# Patient Record
Sex: Male | Born: 1970 | Race: White | Hispanic: No | Marital: Married | Smoking: Former smoker
Health system: Southern US, Community
[De-identification: ages and names within clinical notes are randomized; demographics above are authoritative.]

## PROBLEM LIST (undated history)

## (undated) DIAGNOSIS — B009 Herpesviral infection, unspecified: Secondary | ICD-10-CM

## (undated) DIAGNOSIS — G4730 Sleep apnea, unspecified: Secondary | ICD-10-CM

## (undated) DIAGNOSIS — W3400XA Accidental discharge from unspecified firearms or gun, initial encounter: Secondary | ICD-10-CM

## (undated) DIAGNOSIS — R51 Headache: Secondary | ICD-10-CM

## (undated) DIAGNOSIS — S81839A Puncture wound without foreign body, unspecified lower leg, initial encounter: Secondary | ICD-10-CM

## (undated) DIAGNOSIS — F419 Anxiety disorder, unspecified: Secondary | ICD-10-CM

## (undated) DIAGNOSIS — E785 Hyperlipidemia, unspecified: Secondary | ICD-10-CM

## (undated) DIAGNOSIS — R739 Hyperglycemia, unspecified: Secondary | ICD-10-CM

## (undated) HISTORY — PX: VASECTOMY: SHX75

## (undated) HISTORY — PX: DENTAL TRAUMA REPAIR (TOOTH REIMPLANTATION): SHX1450

## (undated) HISTORY — DX: Hyperglycemia, unspecified: R73.9

## (undated) HISTORY — DX: Herpesviral infection, unspecified: B00.9

## (undated) HISTORY — DX: Headache: R51

## (undated) HISTORY — PX: WISDOM TOOTH EXTRACTION: SHX21

## (undated) HISTORY — DX: Hyperlipidemia, unspecified: E78.5

---

## 1999-05-07 DIAGNOSIS — S81839A Puncture wound without foreign body, unspecified lower leg, initial encounter: Secondary | ICD-10-CM

## 1999-05-07 HISTORY — DX: Puncture wound without foreign body, unspecified lower leg, initial encounter: S81.839A

## 2003-05-12 ENCOUNTER — Encounter: Admission: RE | Admit: 2003-05-12 | Discharge: 2003-05-12 | Payer: Self-pay | Admitting: Internal Medicine

## 2003-06-18 ENCOUNTER — Inpatient Hospital Stay (HOSPITAL_COMMUNITY): Admission: EM | Admit: 2003-06-18 | Discharge: 2003-06-20 | Payer: Self-pay | Admitting: *Deleted

## 2004-04-02 ENCOUNTER — Ambulatory Visit: Payer: Self-pay | Admitting: Internal Medicine

## 2004-05-06 DIAGNOSIS — B009 Herpesviral infection, unspecified: Secondary | ICD-10-CM

## 2004-05-06 HISTORY — DX: Herpesviral infection, unspecified: B00.9

## 2004-12-17 ENCOUNTER — Ambulatory Visit: Payer: Self-pay | Admitting: Family Medicine

## 2005-10-31 ENCOUNTER — Ambulatory Visit: Payer: Self-pay | Admitting: Internal Medicine

## 2005-11-01 ENCOUNTER — Ambulatory Visit: Payer: Self-pay | Admitting: Internal Medicine

## 2006-07-01 ENCOUNTER — Ambulatory Visit: Payer: Self-pay | Admitting: Internal Medicine

## 2006-10-01 ENCOUNTER — Ambulatory Visit: Payer: Self-pay | Admitting: Internal Medicine

## 2007-05-11 ENCOUNTER — Telehealth: Payer: Self-pay | Admitting: Internal Medicine

## 2007-05-26 ENCOUNTER — Ambulatory Visit: Payer: Self-pay | Admitting: Internal Medicine

## 2007-05-29 ENCOUNTER — Telehealth: Payer: Self-pay | Admitting: Internal Medicine

## 2008-01-05 ENCOUNTER — Emergency Department (HOSPITAL_BASED_OUTPATIENT_CLINIC_OR_DEPARTMENT_OTHER): Admission: EM | Admit: 2008-01-05 | Discharge: 2008-01-05 | Payer: Self-pay | Admitting: Emergency Medicine

## 2008-05-02 ENCOUNTER — Telehealth (INDEPENDENT_AMBULATORY_CARE_PROVIDER_SITE_OTHER): Payer: Self-pay | Admitting: *Deleted

## 2008-06-17 ENCOUNTER — Ambulatory Visit: Payer: Self-pay | Admitting: Internal Medicine

## 2008-06-27 LAB — CONVERTED CEMR LAB
ALT: 27 U/L (ref 0–53)
AST: 21 U/L (ref 0–37)
Albumin: 4.1 g/dL (ref 3.5–5.2)
Alkaline Phosphatase: 52 U/L (ref 39–117)
BUN: 9 mg/dL (ref 6–23)
Basophils Absolute: 0 K/uL (ref 0.0–0.1)
Basophils Relative: 0.1 % (ref 0.0–3.0)
Bilirubin, Direct: 0.1 mg/dL (ref 0.0–0.3)
CO2: 31 meq/L (ref 19–32)
Calcium: 9.3 mg/dL (ref 8.4–10.5)
Chloride: 104 meq/L (ref 96–112)
Cholesterol: 244 mg/dL (ref 0–200)
Creatinine, Ser: 1 mg/dL (ref 0.4–1.5)
Direct LDL: 175.4 mg/dL
Eosinophils Absolute: 0.3 K/uL (ref 0.0–0.7)
Eosinophils Relative: 4.4 % (ref 0.0–5.0)
GFR calc Af Amer: 107 mL/min
GFR calc non Af Amer: 88 mL/min
Glucose, Bld: 107 mg/dL — ABNORMAL HIGH (ref 70–99)
HCT: 46.8 % (ref 39.0–52.0)
HDL: 27.5 mg/dL — ABNORMAL LOW (ref >39.0)
Hemoglobin: 16.4 g/dL (ref 13.0–17.0)
Lymphocytes Relative: 35.2 % (ref 12.0–46.0)
MCHC: 35 g/dL (ref 30.0–36.0)
MCV: 85 fL (ref 78.0–100.0)
Monocytes Absolute: 0.6 K/uL (ref 0.1–1.0)
Monocytes Relative: 8.6 % (ref 3.0–12.0)
Neutro Abs: 3.3 K/uL (ref 1.4–7.7)
Neutrophils Relative %: 51.7 % (ref 43.0–77.0)
PSA: 0.37 ng/mL (ref 0.10–4.00)
Platelets: 296 K/uL (ref 150–400)
Potassium: 4 meq/L (ref 3.5–5.1)
RBC: 5.5 M/uL (ref 4.22–5.81)
RDW: 12.6 % (ref 11.5–14.6)
Sodium: 140 meq/L (ref 135–145)
TSH: 0.72 u[IU]/mL (ref 0.35–5.50)
Total Bilirubin: 0.7 mg/dL (ref 0.3–1.2)
Total CHOL/HDL Ratio: 8.9
Total Protein: 6.7 g/dL (ref 6.0–8.3)
Triglycerides: 174 mg/dL — ABNORMAL HIGH (ref 0–149)
VLDL: 35 mg/dL (ref 0–40)
WBC: 6.5 10*3/microliter (ref 4.5–10.5)

## 2008-06-28 ENCOUNTER — Encounter (INDEPENDENT_AMBULATORY_CARE_PROVIDER_SITE_OTHER): Payer: Self-pay | Admitting: *Deleted

## 2008-07-01 ENCOUNTER — Ambulatory Visit: Payer: Self-pay | Admitting: Internal Medicine

## 2008-07-01 DIAGNOSIS — R7303 Prediabetes: Secondary | ICD-10-CM | POA: Insufficient documentation

## 2008-07-01 DIAGNOSIS — E785 Hyperlipidemia, unspecified: Secondary | ICD-10-CM | POA: Insufficient documentation

## 2008-07-01 DIAGNOSIS — T148XXA Other injury of unspecified body region, initial encounter: Secondary | ICD-10-CM | POA: Insufficient documentation

## 2009-01-23 ENCOUNTER — Telehealth (INDEPENDENT_AMBULATORY_CARE_PROVIDER_SITE_OTHER): Payer: Self-pay | Admitting: *Deleted

## 2009-06-05 ENCOUNTER — Encounter (INDEPENDENT_AMBULATORY_CARE_PROVIDER_SITE_OTHER): Payer: Self-pay | Admitting: *Deleted

## 2009-06-06 ENCOUNTER — Telehealth (INDEPENDENT_AMBULATORY_CARE_PROVIDER_SITE_OTHER): Payer: Self-pay | Admitting: *Deleted

## 2009-06-23 ENCOUNTER — Ambulatory Visit: Payer: Self-pay | Admitting: Internal Medicine

## 2009-06-23 LAB — CONVERTED CEMR LAB
Bilirubin Urine: NEGATIVE
Blood in Urine, dipstick: NEGATIVE
Glucose, Urine, Semiquant: NEGATIVE
Ketones, urine, test strip: NEGATIVE
Nitrite: NEGATIVE
Protein, U semiquant: NEGATIVE
Specific Gravity, Urine: 1.01
Urobilinogen, UA: 0.2
WBC Urine, dipstick: NEGATIVE
pH: 5

## 2009-06-26 ENCOUNTER — Ambulatory Visit: Payer: Self-pay | Admitting: Internal Medicine

## 2009-06-26 DIAGNOSIS — J45909 Unspecified asthma, uncomplicated: Secondary | ICD-10-CM | POA: Insufficient documentation

## 2009-06-26 LAB — CONVERTED CEMR LAB
ALT: 32 units/L (ref 0–53)
AST: 25 units/L (ref 0–37)
Albumin: 4.1 g/dL (ref 3.5–5.2)
Alkaline Phosphatase: 48 units/L (ref 39–117)
BUN: 10 mg/dL (ref 6–23)
Basophils Absolute: 0 10*3/uL (ref 0.0–0.1)
Basophils Relative: 0.1 % (ref 0.0–3.0)
Bilirubin, Direct: 0.1 mg/dL (ref 0.0–0.3)
CO2: 30 meq/L (ref 19–32)
Calcium: 9.3 mg/dL (ref 8.4–10.5)
Chloride: 105 meq/L (ref 96–112)
Cholesterol: 233 mg/dL — ABNORMAL HIGH (ref 0–200)
Creatinine, Ser: 1.2 mg/dL (ref 0.4–1.5)
Direct LDL: 158.9 mg/dL
Eosinophils Absolute: 0.4 10*3/uL (ref 0.0–0.7)
Eosinophils Relative: 6.2 % — ABNORMAL HIGH (ref 0.0–5.0)
GFR calc non Af Amer: 70.92 mL/min (ref >60)
Glucose, Bld: 90 mg/dL (ref 70–99)
HCT: 47.3 % (ref 39.0–52.0)
HDL: 34.7 mg/dL — ABNORMAL LOW (ref >39.00)
Hemoglobin: 15.4 g/dL (ref 13.0–17.0)
Lymphocytes Relative: 42.5 % (ref 12.0–46.0)
Lymphs Abs: 2.5 10*3/uL (ref 0.7–4.0)
MCHC: 32.6 g/dL (ref 30.0–36.0)
MCV: 89.1 fL (ref 78.0–100.0)
Monocytes Absolute: 0.6 10*3/uL (ref 0.1–1.0)
Monocytes Relative: 9.6 % (ref 3.0–12.0)
Neutro Abs: 2.5 10*3/uL (ref 1.4–7.7)
Neutrophils Relative %: 41.6 % — ABNORMAL LOW (ref 43.0–77.0)
PSA: 0.29 ng/mL (ref 0.10–4.00)
Platelets: 291 10*3/uL (ref 150.0–400.0)
Potassium: 4.6 meq/L (ref 3.5–5.1)
RBC: 5.3 M/uL (ref 4.22–5.81)
RDW: 12.4 % (ref 11.5–14.6)
Sodium: 140 meq/L (ref 135–145)
TSH: 0.79 microintl units/mL (ref 0.35–5.50)
Total Bilirubin: 0.5 mg/dL (ref 0.3–1.2)
Total CHOL/HDL Ratio: 7
Total Protein: 7 g/dL (ref 6.0–8.3)
Triglycerides: 232 mg/dL — ABNORMAL HIGH (ref 0.0–149.0)
VLDL: 46.4 mg/dL — ABNORMAL HIGH (ref 0.0–40.0)
WBC: 6 10*3/uL (ref 4.5–10.5)

## 2010-06-05 NOTE — Assessment & Plan Note (Signed)
  please see chart entry

## 2010-06-05 NOTE — Progress Notes (Signed)
Summary: Gilbert Perkins-inhaler prescription  Phone Note Call from Patient Call back at Home Phone [phone]   Summary of Call: pt is calling for new prescript for an inhale to be call in to [facility] [address] in [place] Initial call taken by: Freddy Jaksch,  May 11, 2007 2:20 PM  Follow-up for Phone Call        Patient was given a sample of an inhaler at his last office visit (09/2006), doesn't remember the name. Patient was wondering if he could have a prescription, patient aware that I will discuss with Dr.Jessalyn Hinojosa and contact him back. Reviewed paper chart and EMR(forwarded to Dr.) Follow-up by: [name],  May 12, 2007 11:57 AM  Additional Follow-up for Phone Call Additional follow up Details #1::        albuterol HFA  MDI #1, RX 2. 1-2 puffs q 4-6 hrs prn Additional Follow-up by: [name] MD,  May 12, 2007 1:27 PM    Additional Follow-up for Phone Call Additional follow up Details #2::    rx sent to pharmacy pt informed...................................................................Marland KitchenKandice Hams  May 12, 2007 4:51 PM  Follow-up by: Kandice Hams,  May 12, 2007 4:51 PM  New/Updated Medications: PROVENTIL HFA 108 (90 BASE) MCG/ACT  AERS (ALBUTEROL SULFATE) 1-2 puffs every 4-6 hours as needed   Prescriptions: PROVENTIL HFA 108 (90 BASE) MCG/ACT  AERS (ALBUTEROL SULFATE) 1-2 puffs every 4-6 hours as needed  #1 x 2   Entered by:   Kandice Hams   Authorized by:   [name] MD   Signed by:   Kandice Hams on 05/12/2007   Method used:   Electronically sent to ...       [facility] # [phone]*       [address] [place], [place]  [address]       Ph: [phone]       Fax: [phone]   RxID:   [id number]

## 2010-06-05 NOTE — Progress Notes (Signed)
  Phone Note Call from Patient   Caller: Patient Call For: hopp Reason for Call: Refill Medication Summary of Call: Pt states he has called 2x and has not heard back about refill on xanax--- I did not have access to computer when he called.  I told  pt that we did not call in xanax after hours and I would inform hopp of request. Initial call taken by: Loreen Freud DO,  May 02, 2008 11:14 PM      Prescriptions: ALPRAZOLAM 0.5 MG  TABS (ALPRAZOLAM) take 1/2-1 tablet as needed  #30 x 0   Entered and Authorized by:   [name] MD   Signed by:   Kandice Hams on 05/03/2008   Method used:   Printed then faxed to ...       [facility] [address] # [phone]* (retail)       [address] [place], [place]  [address]       Ph: [phone]       Fax: [phone]   RxID:   [id number]   Appended Document:  pt informed rx faxed to [facility]
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
## 2010-06-05 NOTE — Progress Notes (Signed)
Summary: Medication Concerns  Phone Note Call from Patient Call back at Home Phone [phone]   Caller: Patient Summary of Call: Message left on VM: Patient would like to know if Alprazolam was filled. Patient schedule appointment for labs and CPX.   **Place orders for labs** Initial call taken by: [name],  June 06, 2009 10:50 AM  Follow-up for Phone Call        Left message with pharmacy if they did not receive fax yesterday for Alprazolam to fill at this time. Follow-up by: [name],  June 06, 2009 10:56 AM
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
## 2010-06-05 NOTE — Letter (Signed)
Summary: Results Follow up Letter  [facility] [place]  [address] [place], [place] [address]   Phone: [phone]  Fax: [phone]    06/28/2008 MRN: [id number]  [facility] [address] [place], [place]  [address]  Dear Gilbert Perkins,  The following are the results of your recent test(s):  Test         Result    Pap Smear:        Normal _____  Not Normal _____ Comments: ______________________________________________________ Cholesterol: LDL(Bad cholesterol):         Your goal is less than:         HDL (Good cholesterol):       Your goal is more than: Comments:  ______________________________________________________ Mammogram:        Normal _____  Not Normal _____ Comments:  ___________________________________________________________________ Hemoccult:        Normal _____  Not normal _______ Comments:    _____________________________________________________________________ Other Tests: PLEASE SEE ATTACHED LABS DONE ON 06/17/2008    We routinely do not discuss normal results over the telephone.  If you desire a copy of the results, or you have any questions about this information we can discuss them at your next office visit.   Sincerely,

## 2010-06-05 NOTE — Progress Notes (Signed)
Summary: PT IS STILL HAVING PAIN   Phone Note Call from Patient Call back at Home Phone [phone]   Caller: Patient Reason for Call: Acute Illness, Talk to Nurse Summary of Call: DR Lexie Koehl// PT WANTS TO LET YOU KNOW THAT THE SWELLING HAS GONE DOWN BUT HE IS STILL IN PAIN. PT WANTS TO KNOW IF THERE IS ANYTHING HE COULD DO FOR THE PAIN. Initial call taken by: Gilbert Perkins,  May 29, 2007 1:30 PM  Follow-up for Phone Call        PATIENT STILL WITH PAIN IN GROIN AREA, PATIENT SAID PAIN IS ABOUT THE SAME AS IT WAS WHEN HE WAS HERE. PATIENT WANTS TO KNOW WHAT CAN BE DONE ABOUT THE PAIN? Follow-up by: [name],  May 29, 2007 2:07 PM  Additional Follow-up for Phone Call Additional follow up Details #1::        D/C Ultram ; Tylenol # 3 1-2 q 4-6 hrs as needed #30  Additional Follow-up by: [name] MD,  May 29, 2007 2:30 PM    Additional Follow-up for Phone Call Additional follow up Details #2::    SPOKE WITH PATIENT HE SAID TYLENOL#3 REALLY UPSETS HIS STOMACH ANYTHING ELSE WILL BE FINE TO CALL IN TO [facility] [address] [id number]. WILL FORWARD TO DR.[name] FOR DIFFFERENT RX THEN WILL PHONE IN. ..................................................................Marland KitchenChrae Malloy  May 29, 2007 2:38 PM   Additional Follow-up for Phone Call Additional follow up Details #3:: Details for Additional Follow-up Action Taken: Darvocet N 100  1-2 q 4-6 hrs as needed #30 Additional Follow-up by: [name] MD,  May 29, 2007 5:24 PM       Appended Document: PT IS STILL HAVING PAIN  called patient and let him know I called darvocet n-100 to the pharmacist.  .....................................................................kathy bixby, cma

## 2010-06-05 NOTE — Letter (Signed)
Summary: Primary Care Appointment Letter  Gilbert Perkins at [place]  [address] [place], [place] [address]   Phone: [phone]  Fax: [phone]    06/05/2009 MRN: [id number]  [name] [address] [place], [place]  [address]  Dear Gilbert Perkins,   Your Primary Care Physician Gilbert Melnick MD has indicated that:    ___X____it is time to schedule an appointment (Please call to schedule a Physical"30 min appointment" )Come fasting so that any necessary labs can be drawn.  An appointment is necessary to continue refilling medications    _______you missed your appointment on______ and need to call and          reschedule.    _______you need to have lab work done.    _______you need to schedule an appointment discuss lab or test results.    _______you need to call to reschedule your appointment that is                       scheduled on _________.     Please call our office as soon as possible. Our phone number is [phone]. Please press option 1. Our office is open 8a-12noon and 1p-5p, Monday through Friday.     Thank you,    Gilbert Perkins Primary Care Scheduler

## 2010-06-05 NOTE — Progress Notes (Signed)
Summary: refill   Phone Note Refill Request   Refills Requested: Medication #1:  GABAPENTIN 100 MG CAPS 1-2 every 8 hrs as needed. last filled 12-27-08 #60, last OV 07-01-08,is this med to be added to pt list on routine basis to be filled.Marland KitchenMarland KitchenFelecia Deloach CMA  January 23, 2009 3:42 PM    Follow-up for Phone Call        OK X1 Follow-up by: [name] MD,  January 23, 2009 3:52 PM  Additional Follow-up for Phone Call Additional follow up Details #1::        per dr hopper med ok to be filled x1year.Felecia Deloach CMA  January 24, 2009 10:17 AM     Prescriptions: GABAPENTIN 100 MG CAPS (GABAPENTIN) 1-2 every 8 hrs as needed  #60.0 Each x 0   Entered by:   Jeremy Johann CMA   Authorized by:   [name] MD   Signed by:   Jeremy Johann CMA on 01/23/2009   Method used:   Faxed to ...       [facility] [address] # [phone]* (retail)       [address] [place], [place]  [address]       Ph: [phone]       Fax: [phone]   RxID:   [id number]

## 2010-07-31 ENCOUNTER — Other Ambulatory Visit: Payer: Self-pay | Admitting: Internal Medicine

## 2010-08-05 ENCOUNTER — Other Ambulatory Visit: Payer: Self-pay | Admitting: Internal Medicine

## 2010-09-16 ENCOUNTER — Other Ambulatory Visit: Payer: Self-pay | Admitting: Internal Medicine

## 2010-09-17 NOTE — Telephone Encounter (Signed)
Please contact patient and schedule a CPX (30 min appointment), encounter to be signed once appointment scheduled

## 2010-09-19 NOTE — Telephone Encounter (Signed)
Spoke to pt on 5/15 to schedule CPX---he will call back when he has his calendar available///sph
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
## 2010-09-21 NOTE — Discharge Summary (Signed)
NAME:  Gilbert Perkins, Gilbert Perkins                          ACCOUNT NO.:  [id number]   MEDICAL RECORD NO.:  [id number]                   PATIENT TYPE:  INP   LOCATION:  2035                                 FACILITY:  [facility]   PHYSICIAN:  Rene Paci, M.D. [facility]          DATE OF BIRTH:  03-04-1971   DATE OF ADMISSION:  06/18/2003  DATE OF DISCHARGE:  06/20/2003                                 DISCHARGE SUMMARY   DISCHARGE DIAGNOSIS:  Atypical chest pain.   BRIEF ADMISSION HISTORY:  Mr. Corbit is a 40 year old white male who  presented with chest pain around 3:30 on the day of admission.  He describes  the pain as pressure and tightness.  Duration was 30 to 45 minutes.  It was  associated with radiation to the left arm, nausea and diaphoresis.  He did  have relief with sublingual nitroglycerin.  The patient was admitted for  further cardiac evaluation.  Cardiac risk factors were positive for tobacco  use and remote coronary disease in his family.  Negative for hypertension,  diabetes or hyperlipidemia.   HOSPITAL COURSE:  Chest pain - The patient was admitted to rule out  myocardial infarction.  Serial cardiac enzymes were negative.  The patient  was seen in consultation by cardiology.  They did not feel that this was a  cardiac chest pain.  They recommended discontinuing his heparin and  nitroglycerin drip.  They felt that he was stable for discharge home with an  outpatient Cardiolite.  Cardiolite will be arranged.   LABORATORIES AT DISCHARGE:  A fasting lipid profile is pending.   MEDICATIONS AT DISCHARGE:  He is instructed to resume his Wellbutrin and  Klonopin at home.   FOLLOW UP:  Followup with Dr. Alwyn Ren in two to three weeks.  We will also  schedule an outpatient Cardiolite.      Cornell Barman, P.A. [facility]                  Rene Paci, M.D. [facility]    LC/MEDQ  D:  06/20/2003  T:  06/20/2003  Job:  [id number]   cc:   Titus Dubin. Alwyn Ren, M.D. [facility]   Doylene Canning. Ladona Ridgel, M.D.
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
## 2010-09-28 ENCOUNTER — Other Ambulatory Visit: Payer: Self-pay | Admitting: Internal Medicine

## 2010-10-11 NOTE — Telephone Encounter (Signed)
Patient has CPX appt for 01/14/11 at 2:00

## 2010-11-04 ENCOUNTER — Other Ambulatory Visit: Payer: Self-pay | Admitting: Internal Medicine

## 2010-11-15 ENCOUNTER — Emergency Department (HOSPITAL_BASED_OUTPATIENT_CLINIC_OR_DEPARTMENT_OTHER)
Admission: EM | Admit: 2010-11-15 | Discharge: 2010-11-15 | Disposition: A | Discharge status: Home / Self Care | Payer: Worker's Compensation | Attending: Emergency Medicine | Admitting: Emergency Medicine

## 2010-11-15 ENCOUNTER — Encounter: Payer: Self-pay | Admitting: *Deleted

## 2010-11-15 DIAGNOSIS — S8000XA Contusion of unspecified knee, initial encounter: Secondary | ICD-10-CM | POA: Insufficient documentation

## 2010-11-15 DIAGNOSIS — Y92410 Unspecified street and highway as the place of occurrence of the external cause: Secondary | ICD-10-CM | POA: Insufficient documentation

## 2010-11-15 MED ORDER — IBUPROFEN 800 MG PO TABS
800.0000 mg | ORAL_TABLET | Freq: Once | ORAL | Status: AC
  Administered 2010-11-15: 800 mg via ORAL
  Filled 2010-11-15: qty 1

## 2010-11-15 MED ORDER — IBUPROFEN 800 MG PO TABS: 800.0000 mg | ORAL_TABLET | Freq: Three times a day (TID) | ORAL | Status: DC | PRN

## 2010-11-15 MED ORDER — IBUPROFEN 800 MG PO TABS: 800.0000 mg | ORAL_TABLET | Freq: Three times a day (TID) | ORAL | Status: AC | PRN
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
## 2010-11-15 NOTE — ED Notes (Signed)
Seargeant Gilbert Perkins informed this RN that no UDS or BAT is required.
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
## 2010-12-25 ENCOUNTER — Other Ambulatory Visit: Payer: Self-pay | Admitting: Internal Medicine

## 2011-01-14 ENCOUNTER — Encounter: Payer: Self-pay | Admitting: Internal Medicine

## 2011-02-17 ENCOUNTER — Other Ambulatory Visit: Payer: Self-pay | Admitting: Internal Medicine

## 2011-03-26 ENCOUNTER — Encounter: Payer: Self-pay | Admitting: Internal Medicine

## 2011-04-01 ENCOUNTER — Encounter: Payer: Self-pay | Admitting: Internal Medicine

## 2011-04-01 ENCOUNTER — Ambulatory Visit (INDEPENDENT_AMBULATORY_CARE_PROVIDER_SITE_OTHER): Payer: 59 | Admitting: Internal Medicine

## 2011-04-01 VITALS — BP 122/86 | HR 96 | Temp 98.6°F | Resp 12 | Ht 71.0 in | Wt 232.0 lb

## 2011-04-01 DIAGNOSIS — Z0000 Encounter for general adult medical examination without abnormal findings: Secondary | ICD-10-CM

## 2011-04-01 DIAGNOSIS — R0989 Other specified symptoms and signs involving the circulatory and respiratory systems: Secondary | ICD-10-CM

## 2011-04-01 DIAGNOSIS — E785 Hyperlipidemia, unspecified: Secondary | ICD-10-CM

## 2011-04-01 DIAGNOSIS — R0609 Other forms of dyspnea: Secondary | ICD-10-CM

## 2011-04-01 DIAGNOSIS — T148XXA Other injury of unspecified body region, initial encounter: Secondary | ICD-10-CM

## 2011-04-01 MED ORDER — VALACYCLOVIR HCL 1 G PO TABS: 1000.0000 mg | ORAL_TABLET | Freq: Every day | ORAL | Status: DC

## 2011-04-01 MED ORDER — ALBUTEROL SULFATE HFA 108 (90 BASE) MCG/ACT IN AERS: 1.0000 | INHALATION_SPRAY | Freq: Four times a day (QID) | RESPIRATORY_TRACT | Status: DC | PRN

## 2011-04-01 MED ORDER — ALPRAZOLAM 0.5 MG PO TABS: 0.5000 mg | ORAL_TABLET | Freq: Three times a day (TID) | ORAL | Status: DC | PRN

## 2011-04-01 NOTE — Patient Instructions (Signed)
Preventive Health Care: Exercise at least 30-45 minutes a day,  3-4 days a week.  Eat a low-fat diet with lots of fruits and vegetables, up to 7-9 servings per day.Consume less than 40 grams of sugar per day from foods & drinks with High Fructose Corn Sugar as # 1,2,3 or # 4 on label. Health Care Power of Attorney & Living Will. Complete if not in place ; these place you in charge of your health care decisions. To prevent sleep dysfunction follow these instructions for sleep hygiene. Do not read, watch TV, or eat in bed. Do not get into bed until you are ready to turn off the light &  to go to sleep. Do not ingest stimulants ( decongestants, diet pills, nicotine, caffeine) after the evening meal. Please  schedule fasting Labs : BMET,Lipids, hepatic panel, CBC & dif, TSH.  Please bring these instructions to that Lab appt.
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
## 2011-04-02 ENCOUNTER — Encounter: Payer: Self-pay | Admitting: Neurology

## 2011-04-02 ENCOUNTER — Encounter: Payer: Self-pay | Admitting: Internal Medicine

## 2011-04-05 ENCOUNTER — Other Ambulatory Visit: Payer: Self-pay | Admitting: Internal Medicine

## 2011-04-05 DIAGNOSIS — R0609 Other forms of dyspnea: Secondary | ICD-10-CM

## 2011-04-05 DIAGNOSIS — E785 Hyperlipidemia, unspecified: Secondary | ICD-10-CM

## 2011-04-05 DIAGNOSIS — R0989 Other specified symptoms and signs involving the circulatory and respiratory systems: Secondary | ICD-10-CM

## 2011-04-05 DIAGNOSIS — Z0000 Encounter for general adult medical examination without abnormal findings: Secondary | ICD-10-CM

## 2011-04-08 ENCOUNTER — Other Ambulatory Visit (INDEPENDENT_AMBULATORY_CARE_PROVIDER_SITE_OTHER): Payer: 59

## 2011-04-08 DIAGNOSIS — R0989 Other specified symptoms and signs involving the circulatory and respiratory systems: Secondary | ICD-10-CM

## 2011-04-08 DIAGNOSIS — E785 Hyperlipidemia, unspecified: Secondary | ICD-10-CM

## 2011-04-08 DIAGNOSIS — Z0000 Encounter for general adult medical examination without abnormal findings: Secondary | ICD-10-CM

## 2011-04-08 DIAGNOSIS — R0609 Other forms of dyspnea: Secondary | ICD-10-CM

## 2011-04-08 LAB — CBC WITH DIFFERENTIAL/PLATELET
Basophils Absolute: 0 10*3/uL (ref 0.0–0.1)
Basophils Relative: 0.7 % (ref 0.0–3.0)
Eosinophils Absolute: 0.5 10*3/uL (ref 0.0–0.7)
Eosinophils Relative: 6.1 % — ABNORMAL HIGH (ref 0.0–5.0)
HCT: 44.8 % (ref 39.0–52.0)
Hemoglobin: 15.1 g/dL (ref 13.0–17.0)
Lymphocytes Relative: 35.8 % (ref 12.0–46.0)
Lymphs Abs: 2.7 10*3/uL (ref 0.7–4.0)
MCHC: 33.8 g/dL (ref 30.0–36.0)
MCV: 86.7 fl (ref 78.0–100.0)
Monocytes Absolute: 0.6 10*3/uL (ref 0.1–1.0)
Monocytes Relative: 8.3 % (ref 3.0–12.0)
Neutro Abs: 3.7 10*3/uL (ref 1.4–7.7)
Neutrophils Relative %: 49.1 % (ref 43.0–77.0)
Platelets: 328 10*3/uL (ref 150.0–400.0)
RBC: 5.16 Mil/uL (ref 4.22–5.81)
RDW: 13.1 % (ref 11.5–14.6)
WBC: 7.5 10*3/uL (ref 4.5–10.5)

## 2011-04-08 LAB — HEPATIC FUNCTION PANEL
ALT: 32 U/L (ref 0–53)
AST: 26 U/L (ref 0–37)
Albumin: 4.1 g/dL (ref 3.5–5.2)
Alkaline Phosphatase: 51 U/L (ref 39–117)
Bilirubin, Direct: 0 mg/dL (ref 0.0–0.3)
Total Bilirubin: 0.4 mg/dL (ref 0.3–1.2)
Total Protein: 6.7 g/dL (ref 6.0–8.3)

## 2011-04-08 LAB — LIPID PANEL
Cholesterol: 261 mg/dL — ABNORMAL HIGH (ref 0–200)
HDL: 34.4 mg/dL — ABNORMAL LOW (ref >39.00)
Total CHOL/HDL Ratio: 8
Triglycerides: 220 mg/dL — ABNORMAL HIGH (ref 0.0–149.0)
VLDL: 44 mg/dL — ABNORMAL HIGH (ref 0.0–40.0)

## 2011-04-08 LAB — TSH: TSH: 0.77 u[IU]/mL (ref 0.35–5.50)

## 2011-04-08 LAB — BASIC METABOLIC PANEL
BUN: 12 mg/dL (ref 6–23)
CO2: 30 mEq/L (ref 19–32)
Calcium: 9 mg/dL (ref 8.4–10.5)
Chloride: 105 mEq/L (ref 96–112)
Creatinine, Ser: 1.1 mg/dL (ref 0.4–1.5)
GFR: 74.59 mL/min (ref >60.00)
Glucose, Bld: 95 mg/dL (ref 70–99)
Potassium: 4.4 mEq/L (ref 3.5–5.1)
Sodium: 140 mEq/L (ref 135–145)

## 2011-04-08 LAB — LDL CHOLESTEROL, DIRECT: Direct LDL: 186.7 mg/dL

## 2011-04-08 NOTE — Progress Notes (Signed)
12  

## 2011-04-09 ENCOUNTER — Institutional Professional Consult (permissible substitution): Payer: Self-pay | Admitting: Pulmonary Disease

## 2011-05-01 ENCOUNTER — Encounter: Payer: Self-pay | Admitting: Pulmonary Disease

## 2011-05-01 ENCOUNTER — Ambulatory Visit (INDEPENDENT_AMBULATORY_CARE_PROVIDER_SITE_OTHER): Payer: 59 | Admitting: Pulmonary Disease

## 2011-05-01 VITALS — BP 118/82 | HR 113 | Temp 98.7°F | Ht 71.0 in | Wt 241.0 lb

## 2011-05-01 DIAGNOSIS — R0609 Other forms of dyspnea: Secondary | ICD-10-CM

## 2011-05-01 DIAGNOSIS — G4733 Obstructive sleep apnea (adult) (pediatric): Secondary | ICD-10-CM | POA: Insufficient documentation

## 2011-05-01 DIAGNOSIS — R0683 Snoring: Secondary | ICD-10-CM

## 2011-05-01 DIAGNOSIS — R0989 Other specified symptoms and signs involving the circulatory and respiratory systems: Secondary | ICD-10-CM

## 2011-05-01 NOTE — Assessment & Plan Note (Signed)
Given excessive daytime somnolence, narrow pharyngeal exam, witnessed apneas & loud snoring, obstructive sleep apnea is very likely & an overnight polysomnogram will be scheduled as a split study. The pathophysiology of obstructive sleep apnea , it's cardiovascular consequences & modes of treatment including CPAP were discused with the patient in detail & they evidenced understanding.  
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
## 2011-05-01 NOTE — Patient Instructions (Signed)
We discussed physiology of obstructive sleep apnea  Sleep study in the lab

## 2011-05-13 ENCOUNTER — Ambulatory Visit (HOSPITAL_BASED_OUTPATIENT_CLINIC_OR_DEPARTMENT_OTHER): Payer: 59 | Attending: Pulmonary Disease | Admitting: General Practice

## 2011-05-13 ENCOUNTER — Encounter: Payer: Self-pay | Admitting: Neurology

## 2011-05-13 ENCOUNTER — Ambulatory Visit (INDEPENDENT_AMBULATORY_CARE_PROVIDER_SITE_OTHER): Payer: 59 | Admitting: Neurology

## 2011-05-13 VITALS — BP 118/72 | HR 92 | Wt 240.0 lb

## 2011-05-13 VITALS — Ht 72.0 in | Wt 230.0 lb

## 2011-05-13 DIAGNOSIS — G4733 Obstructive sleep apnea (adult) (pediatric): Secondary | ICD-10-CM

## 2011-05-13 DIAGNOSIS — M79609 Pain in unspecified limb: Secondary | ICD-10-CM

## 2011-05-13 DIAGNOSIS — R0683 Snoring: Secondary | ICD-10-CM

## 2011-05-13 DIAGNOSIS — R0989 Other specified symptoms and signs involving the circulatory and respiratory systems: Secondary | ICD-10-CM | POA: Insufficient documentation

## 2011-05-13 DIAGNOSIS — T148XXA Other injury of unspecified body region, initial encounter: Secondary | ICD-10-CM

## 2011-05-13 DIAGNOSIS — R0609 Other forms of dyspnea: Secondary | ICD-10-CM | POA: Insufficient documentation

## 2011-05-13 MED ORDER — PREGABALIN 50 MG PO CAPS: 50.0000 mg | ORAL_CAPSULE | Freq: Three times a day (TID) | ORAL | Status: DC

## 2011-05-13 NOTE — Patient Instructions (Addendum)
Lyrica 50mg  caps  take 1 once a day for 1 week, then increase to 1 twice a day for 1 week, then increase to 1 three times a day from then on.  Your follow up appointment with Dr. Modesto Charon is scheduled for April 8th at 2:30 pm.
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
## 2011-05-24 ENCOUNTER — Ambulatory Visit (INDEPENDENT_AMBULATORY_CARE_PROVIDER_SITE_OTHER): Payer: 59 | Admitting: Pulmonary Disease

## 2011-05-24 ENCOUNTER — Encounter: Payer: Self-pay | Admitting: Pulmonary Disease

## 2011-05-24 DIAGNOSIS — R0609 Other forms of dyspnea: Secondary | ICD-10-CM

## 2011-05-24 DIAGNOSIS — R0683 Snoring: Secondary | ICD-10-CM

## 2011-05-24 DIAGNOSIS — R0989 Other specified symptoms and signs involving the circulatory and respiratory systems: Secondary | ICD-10-CM

## 2011-05-24 NOTE — Patient Instructions (Signed)
You do not have obstructive sleep apnea  Please ask your dentist if they will help you with an ORAL APPLIANCE for snoring Our dentist is [phone] - Dr Myrtis Ser Trial of nasal steroid spray -each nare at bedtime - call us for Rx if no side effects Call us with feedback after above done

## 2011-05-24 NOTE — Progress Notes (Signed)
  Subjective:    Patient ID: Gilbert Perkins, male    DOB: 1970-05-07, 41 y.o.   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  Wife has witnessed apneas & loud snoring regardless of body position. He has gained 20 lbs over the last 2 yrs. He quit smoking in 2005 - about 15 pyrs. 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   05/24/2011 Discussed pSG - no sleep disordered breathing, AHI 3/h, no PLMs or behaviour disturbance There is no history suggestive of cataplexy, sleep paralysis or parasomnias Has tried snoring strips    Review of Systems Patient denies significant dyspnea,cough, hemoptysis,  chest pain, palpitations, pedal edema, orthopnea, paroxysmal nocturnal dyspnea, lightheadedness, nausea, vomiting, abdominal or  leg pains      Objective:   Physical Exam  Gen. Pleasant, well-nourished, in no distress ENT - no lesions, no post nasal drip Neck: No JVD, no thyromegaly, no carotid bruits Lungs: no use of accessory muscles, no dullness to percussion, clear without rales or rhonchi  Cardiovascular: Rhythm regular, heart sounds  normal, no murmurs or gallops, no peripheral edema Musculoskeletal: No deformities, no cyanosis or clubbing         Assessment & Plan:

## 2011-05-24 NOTE — Assessment & Plan Note (Addendum)
You do not have obstructive sleep apnea  Please ask your dentist if they will help you with an ORAL APPLIANCE for snoring Our dentist is [phone] - Dr Myrtis Ser Trial of nasal steroid spray -each nare at bedtime - call us for Rx if no side effects Call us with feedback after above done Discussed CPAP & surgery as alternatives

## 2011-05-25 NOTE — Procedures (Signed)
NAMEWEBB, WEED                ACCOUNT NO.:  [id number]  MEDICAL RECORD NO.:  [id number]          PATIENT TYPE:  OUT  LOCATION:  SLEEP CENTER                 FACILITY:  [facility]  PHYSICIAN:  Oretha Milch, MD      DATE OF BIRTH:  [birth year]  DATE OF STUDY:  05/13/2011                           NOCTURNAL POLYSOMNOGRAM  REFERRING PHYSICIAN:  Oretha Milch, MD  INDICATION FOR STUDY:  Loud snoring and gasping episodes during sleep, unwitnessed apneas in this 41 year old gentleman with a height of 6 feet and weight of 230 inches, BMI of 31.  Neck size 17 inch.  EPWORTH SLEEPINESS SCORE:  7.  This nocturnal polysomnogram was performed with sleep technologist in attendance, EEG, EOG, EMG, EKG, and respiratory parameters were recorded.  Sleep stages, arousals, limb movements, and respiratory data were scored according to criteria laid out by the American Academy of Sleep Medicine.  SLEEP ARCHITECTURE:  Lights out was at 10:38 p.m.  Lights on was at 4:55 a.m.  Total sleep time was 297 minutes with a sleep period time 359 minutes and sleep efficiency of 79%.  Sleep latency was 17 minutes. Latency to REM sleep was 76 minutes.  Sleep stages as percentage of total sleep time was N1 8%, N2 78%, N3 6%.  REM sleep 7% (22 minutes). Supine sleep accounted for 39 minutes.  Supine REM sleep was noted for 7 minutes.  REM sleep was noted in 3 stages with the longest around 3:30 a.m.  AROUSAL DATA:  There were total of 48 arousals with an arousal index of 10 events per hour of these 47 were spontaneous.  RESPIRATORY DATA:  There were total 0 obstructive apnea, 0 central apneas, 0 mixed apneas, 7 hypopneas, and apnea-hypopnea index of 1.4 events per hour, 2 RERAs were noted and his hypopnea was 27 seconds.  LIMB MOVEMENT DATA:  No significant limb movements were noted.  OXYGEN SATURATION DATA:  The desaturation index was 3 events per hour. He spent 0 minutes with a saturation less than  88%.  CARDIAC DATA:  The low heart rate was 36 beats per minute.  The high heart rate recorded was an artifact.  DISCUSSION:  No significant desaturations or limb movements noted.  IMPRESSIONS- 1. No evidence of sleep-disordered breathing. 2. No evidence of cardiac arrhythmias, limb movements, or behavioral     disturbance during sleep.  RECOMMENDATIONS: 1. The treatment for simple snoring includes nasal steroids.  Other     options include oral appliance or CPAP therapy or ENT evaluation if     indicated.     Oretha Milch, MD    RVA/MEDQ  D:  05/24/2011 11:15:13  T:  05/25/2011 04:57:38  Job:  [id number]

## 2011-08-12 ENCOUNTER — Ambulatory Visit (INDEPENDENT_AMBULATORY_CARE_PROVIDER_SITE_OTHER): Payer: Worker's Compensation | Admitting: Neurology

## 2011-08-12 ENCOUNTER — Encounter: Payer: Self-pay | Admitting: Neurology

## 2011-08-12 VITALS — BP 116/76 | HR 88 | Wt 240.0 lb

## 2011-08-12 DIAGNOSIS — T148XXA Other injury of unspecified body region, initial encounter: Secondary | ICD-10-CM

## 2011-08-12 MED ORDER — DULOXETINE HCL 30 MG PO CPEP: ORAL_CAPSULE | ORAL | Status: DC
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
## 2011-08-13 ENCOUNTER — Other Ambulatory Visit: Payer: Self-pay | Admitting: Internal Medicine

## 2011-09-10 ENCOUNTER — Other Ambulatory Visit: Payer: Self-pay | Admitting: Internal Medicine

## 2011-10-25 ENCOUNTER — Encounter: Payer: Self-pay | Admitting: Neurology

## 2011-10-25 ENCOUNTER — Ambulatory Visit (INDEPENDENT_AMBULATORY_CARE_PROVIDER_SITE_OTHER): Payer: PRIVATE HEALTH INSURANCE | Admitting: Neurology

## 2011-10-25 VITALS — BP 110/82 | HR 100 | Wt 232.0 lb

## 2011-10-25 DIAGNOSIS — T148XXA Other injury of unspecified body region, initial encounter: Secondary | ICD-10-CM

## 2011-10-25 MED ORDER — AMITRIPTYLINE HCL 25 MG PO TABS: ORAL_TABLET | ORAL | Status: DC
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
## 2012-01-01 ENCOUNTER — Other Ambulatory Visit: Payer: Self-pay | Admitting: Internal Medicine

## 2012-01-17 ENCOUNTER — Encounter: Payer: Self-pay | Admitting: Neurology

## 2012-01-17 ENCOUNTER — Ambulatory Visit (INDEPENDENT_AMBULATORY_CARE_PROVIDER_SITE_OTHER): Payer: Worker's Compensation | Admitting: Neurology

## 2012-01-17 VITALS — BP 116/80 | HR 84 | Wt 241.0 lb

## 2012-01-17 DIAGNOSIS — M792 Neuralgia and neuritis, unspecified: Secondary | ICD-10-CM

## 2012-01-17 DIAGNOSIS — IMO0002 Reserved for concepts with insufficient information to code with codable children: Secondary | ICD-10-CM

## 2012-01-17 MED ORDER — OXCARBAZEPINE 150 MG PO TABS: ORAL_TABLET | ORAL | Status: DC

## 2012-01-17 NOTE — Patient Instructions (Signed)
Titration to Trileptal(oxcarbazepine) 300mg  twice a day using  150mg  tablets.   Morning Dose Evening Dose  Week 1 0 tablets  1 tablet (150mg )  Week 2 1 tablet (150mg ) 1 tablet (150mg )  Week 3 1 tablet (150mg ) 2 tablets (300mg )  Week 4 2 tablets (300mg ) 2 tablets (300mg )    After Week 4 continue at 2 tablets twice a day. Titration requires 70 150mg  tablets.
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
## 2012-02-05 ENCOUNTER — Other Ambulatory Visit: Payer: Self-pay | Admitting: Internal Medicine

## 2012-03-09 ENCOUNTER — Other Ambulatory Visit: Payer: Self-pay | Admitting: Internal Medicine

## 2012-03-09 MED ORDER — ALPRAZOLAM 0.5 MG PO TABS: ORAL_TABLET | ORAL | Status: DC

## 2012-03-09 NOTE — Telephone Encounter (Signed)
OK X1 

## 2012-03-09 NOTE — Telephone Encounter (Signed)
Ok to refill 

## 2012-03-09 NOTE — Telephone Encounter (Signed)
refill ALPRAZolam (Tab) 0.5 MG TAKE 1 TABLET BY MOUTH 3 TIMES A DAY AS NEEDED FOR ANXIETY #30 last fill 10.03.13--last ov 11.26.12 V70 no future appts scheduled

## 2012-03-09 NOTE — Telephone Encounter (Signed)
RX called in .

## 2012-03-15 ENCOUNTER — Emergency Department (HOSPITAL_BASED_OUTPATIENT_CLINIC_OR_DEPARTMENT_OTHER): Payer: 59

## 2012-03-15 ENCOUNTER — Encounter (HOSPITAL_BASED_OUTPATIENT_CLINIC_OR_DEPARTMENT_OTHER): Payer: Self-pay | Admitting: *Deleted

## 2012-03-15 ENCOUNTER — Emergency Department (HOSPITAL_BASED_OUTPATIENT_CLINIC_OR_DEPARTMENT_OTHER)
Admission: EM | Admit: 2012-03-15 | Discharge: 2012-03-15 | Disposition: A | Discharge status: Home / Self Care | Payer: 59 | Attending: Emergency Medicine | Admitting: Emergency Medicine

## 2012-03-15 DIAGNOSIS — Z79899 Other long term (current) drug therapy: Secondary | ICD-10-CM | POA: Insufficient documentation

## 2012-03-15 DIAGNOSIS — X500XXA Overexertion from strenuous movement or load, initial encounter: Secondary | ICD-10-CM | POA: Insufficient documentation

## 2012-03-15 DIAGNOSIS — IMO0002 Reserved for concepts with insufficient information to code with codable children: Secondary | ICD-10-CM | POA: Insufficient documentation

## 2012-03-15 DIAGNOSIS — E785 Hyperlipidemia, unspecified: Secondary | ICD-10-CM | POA: Insufficient documentation

## 2012-03-15 DIAGNOSIS — J45909 Unspecified asthma, uncomplicated: Secondary | ICD-10-CM | POA: Insufficient documentation

## 2012-03-15 DIAGNOSIS — Y929 Unspecified place or not applicable: Secondary | ICD-10-CM | POA: Insufficient documentation

## 2012-03-15 DIAGNOSIS — S39011A Strain of muscle, fascia and tendon of abdomen, initial encounter: Secondary | ICD-10-CM

## 2012-03-15 DIAGNOSIS — R7309 Other abnormal glucose: Secondary | ICD-10-CM | POA: Insufficient documentation

## 2012-03-15 DIAGNOSIS — Z87891 Personal history of nicotine dependence: Secondary | ICD-10-CM | POA: Insufficient documentation

## 2012-03-15 DIAGNOSIS — Y9389 Activity, other specified: Secondary | ICD-10-CM | POA: Insufficient documentation

## 2012-03-15 MED ORDER — IBUPROFEN 600 MG PO TABS: 600.0000 mg | ORAL_TABLET | Freq: Four times a day (QID) | ORAL | Status: DC | PRN

## 2012-03-15 MED ORDER — METHOCARBAMOL 500 MG PO TABS: 500.0000 mg | ORAL_TABLET | Freq: Two times a day (BID) | ORAL | Status: DC

## 2012-03-15 MED ORDER — HYDROCODONE-ACETAMINOPHEN 5-325 MG PO TABS: 1.0000 | ORAL_TABLET | ORAL | Status: DC | PRN

## 2012-03-15 MED ORDER — KETOROLAC TROMETHAMINE 60 MG/2ML IM SOLN
60.0000 mg | Freq: Once | INTRAMUSCULAR | Status: AC
Start: 2012-03-15 — End: 2012-03-15
  Administered 2012-03-15: 60 mg via INTRAMUSCULAR
  Filled 2012-03-15: qty 2

## 2012-03-15 NOTE — ED Notes (Signed)
Pt presents to ED today with right sided rib pain after sneezing last night

## 2012-03-15 NOTE — ED Provider Notes (Signed)
History     CSN: [id number]  Arrival date & time 03/15/12  4098   First MD Initiated Contact with Patient 03/15/12 [phone]      Chief Complaint  Patient presents with  . Chest Pain    (Consider location/radiation/quality/duration/timing/severity/associated sxs/prior treatment) HPI Pt sneezed last night and had L upper abd pain. Pain is worse with deep inspiration and light palpation. No N/V, SOB, chest or rib pain. No fever or chills. Pt took a hydrocodone before coming in with improvement of pain Past Medical History  Diagnosis Date  . Asthma   . Hyperlipidemia     mainly elevated TG  . Hyperglycemia   . Herpes 2006    HSC 24.53 (neg < 0.90)  . Headache     Past Surgical History  Procedure Date  . Vasectomy   . Wisdom tooth extraction     Family History  Problem Relation Age of Onset  . Diabetes Father     ??  . Hypertension Maternal Grandfather   . Heart disease Maternal Grandfather     MI     History  Substance Use Topics  . Smoking status: Former Smoker -- 1.5 packs/day for 15 years    Types: Cigarettes    Quit date: 05/07/2003  . Smokeless tobacco: Former Neurosurgeon    Types: Snuff    Quit date: 02/03/2005  . Alcohol Use: Yes     Comment: occasionally      Review of Systems  Constitutional: Negative for fever and chills.  Respiratory: Negative for cough, chest tightness and shortness of breath.   Cardiovascular: Negative for chest pain.  Gastrointestinal: Positive for abdominal pain. Negative for nausea, vomiting and diarrhea.  Musculoskeletal: Negative for back pain.  Skin: Negative for rash and wound.  Neurological: Negative for dizziness, weakness, light-headedness, numbness and headaches.    Allergies  Review of patient's allergies indicates no known allergies.  Home Medications   Current Outpatient Rx  Name  Route  Sig  Dispense  Refill  . AMITRIPTYLINE HCL 25 MG PO TABS      start with 1/2 tab at night for 2 weeks, then 1 tab at night for  2 weeks, then 1.5 tabs at night for 2 weeks, then 2 tabs at night then on   60 tablet   3   . OXCARBAZEPINE 150 MG PO TABS      increase to 300 mg two times per day as directed.   120 tablet   3   . ALPRAZOLAM 0.5 MG PO TABS      TAKE 1 TABLET BY MOUTH 3 TIMES A DAY AS NEEDED FOR ANXIETY   30 tablet   0   . HYDROCODONE-ACETAMINOPHEN 5-325 MG PO TABS   Oral   Take 1 tablet by mouth every 4 (four) hours as needed for pain.   10 tablet   0   . IBUPROFEN 600 MG PO TABS   Oral   Take 1 tablet (600 mg total) by mouth every 6 (six) hours as needed for pain.   30 tablet   0   . METHOCARBAMOL 500 MG PO TABS   Oral   Take 1 tablet (500 mg total) by mouth 2 (two) times daily.   20 tablet   0   . VALACYCLOVIR HCL 1 G PO TABS   Oral   Take 1 tablet (1,000 mg total) by mouth daily.   90 tablet   3   . VENTOLIN HFA 108 (90 BASE) MCG/ACT  IN AERS      INHALE 1 PUFF EVERY 6 HOURS AS NEEDED FOR WHEEZING   18 g   5     BP 121/87  Pulse 72  Temp 98.1 F (36.7 C)  Resp 18  Ht 5\' 11"  (1.803 m)  Wt 230 lb (104.327 kg)  BMI 32.08 kg/m2  SpO2 99%  Physical Exam  Nursing note and vitals reviewed. Constitutional: He is oriented to person, place, and time. He appears well-developed and well-nourished. No distress.  HENT:  Head: Normocephalic and atraumatic.  Mouth/Throat: Oropharynx is clear and moist.  Eyes: EOM are normal. Pupils are equal, round, and reactive to light.  Neck: Normal range of motion. Neck supple.  Cardiovascular: Normal rate and regular rhythm.  Exam reveals no gallop and no friction rub.   No murmur heard. Pulmonary/Chest: Effort normal and breath sounds normal. No respiratory distress. He has no wheezes. He has no rales. He exhibits no tenderness.  Abdominal: Soft. Bowel sounds are normal. He exhibits no distension and no mass. There is tenderness (Pt is tender to light palpation along R rectus abd muscle. No obvious injuries. No rebound or guarding. ).  There is no rebound and no guarding.  Musculoskeletal: Normal range of motion. He exhibits no edema and no tenderness.  Neurological: He is alert and oriented to person, place, and time.  Skin: Skin is warm and dry. No rash noted. No erythema.  Psychiatric: He has a normal mood and affect. His behavior is normal.    ED Course  Procedures (including critical care time)  Labs Reviewed - No data to display Dg Chest 2 View  03/15/2012  *RADIOLOGY REPORT*  Clinical Data: Chest pain  CHEST - 2 VIEW  Comparison: 06/19/2003  Findings: Cardiomediastinal silhouette is stable.  No acute infiltrate or pleural effusion.  No pulmonary edema.  The bony thorax is stable.  IMPRESSION: No active disease.  No significant change.   Original Report Authenticated By: Natasha Mead, M.D.      1. Abdominal wall strain     Bedside US with ne free fluid in abd particularly hepatic renal recess.   MDM  Likely abd strain. Do not suspect underlying solid organ injury. Normal VS (HR), [place] and unlikely mechanism for injury. Pt strongly encouraged to return for worsening pain, lightheadedness, N/V, fever or any concerns        Loren Racer, MD 03/15/12 1024

## 2012-04-26 ENCOUNTER — Other Ambulatory Visit: Payer: Self-pay | Admitting: Internal Medicine

## 2012-04-27 NOTE — Telephone Encounter (Signed)
Patient aware to stop by office to receive rx and sign contract

## 2012-05-06 HISTORY — PX: HERNIA REPAIR: SHX51

## 2012-05-29 ENCOUNTER — Encounter: Payer: Self-pay | Admitting: Internal Medicine

## 2012-05-30 ENCOUNTER — Other Ambulatory Visit: Payer: Self-pay | Admitting: Internal Medicine

## 2012-07-13 ENCOUNTER — Other Ambulatory Visit: Payer: Self-pay | Admitting: Internal Medicine

## 2012-07-14 NOTE — Telephone Encounter (Signed)
Left message on VM informing patient he is due for CPX with Dr.Hopper, patient instructed to call and schedule

## 2012-08-17 ENCOUNTER — Ambulatory Visit (INDEPENDENT_AMBULATORY_CARE_PROVIDER_SITE_OTHER): Payer: 59 | Admitting: Internal Medicine

## 2012-08-17 ENCOUNTER — Encounter: Payer: Self-pay | Admitting: Internal Medicine

## 2012-08-17 VITALS — BP 120/82 | HR 99 | Wt 238.8 lb

## 2012-08-17 DIAGNOSIS — M21619 Bunion of unspecified foot: Secondary | ICD-10-CM

## 2012-08-17 DIAGNOSIS — R0609 Other forms of dyspnea: Secondary | ICD-10-CM

## 2012-08-17 DIAGNOSIS — E785 Hyperlipidemia, unspecified: Secondary | ICD-10-CM

## 2012-08-17 DIAGNOSIS — J45909 Unspecified asthma, uncomplicated: Secondary | ICD-10-CM

## 2012-08-17 DIAGNOSIS — M21612 Bunion of left foot: Secondary | ICD-10-CM

## 2012-08-17 DIAGNOSIS — R0989 Other specified symptoms and signs involving the circulatory and respiratory systems: Secondary | ICD-10-CM

## 2012-08-17 DIAGNOSIS — R7309 Other abnormal glucose: Secondary | ICD-10-CM

## 2012-08-17 DIAGNOSIS — R0683 Snoring: Secondary | ICD-10-CM

## 2012-08-17 NOTE — Patient Instructions (Addendum)
The most common cause of elevated triglycerides (TG) is the ingestion of sugar from high fructose corn syrup sources added to processed foods & drinks.  Eat a low-fat diet with lots of fruits and vegetables, up to 7-9 servings per day. Consume less than 40 Grams (preferably ZERO) of sugar per day from foods & drinks with High Fructose Corn Syrup (HFCS) sugar as #1,2,3 or # 4 on label.Whole [employer], [employer] & [employer] do not carry products with HFCS. Please  schedule fasting Labs in 12 weeks after nutrition changes: BMET,NMR Lipoprofile Lipid Panel, hepatic panel, TSH, A1c. PLEASE BRING THESE INSTRUCTIONS TO FOLLOW UP  LAB APPOINTMENT.This will guarantee correct labs are drawn, eliminating need for repeat blood sampling ( needle sticks ! ). Diagnoses /Codes: 272.4,790.29 , V17.3 Please see preop clearance note ;you are medically cleared for surgery.

## 2012-08-17 NOTE — Assessment & Plan Note (Signed)
There is no active reactive airways disease. 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

## 2012-08-17 NOTE — Assessment & Plan Note (Signed)
Elevated triglycerides are a pre diabetes pattern. A1c recommended

## 2012-08-17 NOTE — Assessment & Plan Note (Signed)
Advanced cholesterol testing recommended to assess long-term risk

## 2012-08-17 NOTE — Progress Notes (Signed)
Subjective:    Patient ID: Gilbert Perkins, male    DOB: Jan 06, 1971, 42 y.o.   MRN: [id number]  HPI Pre op evaluation: Surgical diagnosis/ Procedure :Bunion/ L  5th metatarsal osteotomy Tentative surgical date/Surgeon:08/31/12 / Dr Caesar Bookman Severity:up to 10 Pain: sharp & throbbing Activity of daily living limitation/impairment of function:avoiding execise, standing limited. Limping with ambulation Treatment to date, efficacy:intra-articular cortisone X 5 with decreasing response  Significant past medical history: fasting hyperglycemia; dyslipidemia; EIB/RAD   Review of Systems Constitutional: No fever, chills, significant weight change, fatigue, weakness or night sweats Cardiovascular: no chest pain, palpitations, racing, irregular rhythm, syncope, nausea, sweating, claudication, or edema  Respiratory: No cough, sputum production,hemoptysis,  dyspnea, paroxysmal nocturnal dyspnea, or apnea. History of  significant snoring.    Gastrointestinal: No heartburn,dysphagia, nausea and vomiting,ominal pain, change in bowels, anorexia, diarrhea, significant constipation, rectal bleeding, melena,  stool incontinence or jaundice Genitourinary: No dysuria,hematuria, pyuria, frequency, urgency,  incontinence, nocturia, dark urine or flank pain Musculoskeletal: No myalgias or muscle cramping weakness Dermatologic: No rash or change in color or temperature of skin Neurologic: No headache, vertigo, limb weakness,  numbness or tingling Psychiatric: No significant anxiety or depression Endocrine: No change in hair/skin/ nails, excessive thirst, excessive hunger, excessive urination, or unexplained fatigue Hematologic/lymphatic: No bruising, lymphadenopathy,or  abnormal clotting Allergy/immunology: No itchy/ watery eyes, abnormal sneezing, rhinitis, urticaria ,or angioedema     Objective:   Physical Exam Gen.: Healthy and well-nourished in appearance. Alert, appropriate and cooperative throughout  exam. Head: Normocephalic without obvious abnormalities; head shaven  Eyes: No corneal or conjunctival inflammation noted. Ears: External  ear exam reveals no significant lesions or deformities. Canals clear .TMs normal. Hearing is grossly normal bilaterally. Nose: External nasal exam reveals no deformity or inflammation. Nasal mucosa are pink and moist. No lesions or exudates noted.  Mouth: Oral mucosa and oropharynx reveal no lesions or exudates. Teeth in good repair. Neck: No deformities, masses, or tenderness noted.  Thyroid normal. Lungs: Normal respiratory effort; chest expands symmetrically. Lungs are clear to auscultation without rales, wheezes, or increased work of breathing. Heart: Normal rate and rhythm. Normal S1 and S2. No gallop, click, or rub. S4 w/o murmur. Abdomen: Bowel sounds normal; abdomen soft and nontender. No masses, organomegaly or hernias noted.                            Musculoskeletal/extremities: No deformity or scoliosis noted of  the thoracic or lumbar spine.  No clubbing, cyanosis, edema, or significant extremity  deformity noted. Range of motion normal .Tone & strength  Normal. Joints normal except slight swelling & erythema @ base L 5th toe ventrally . Nail health good. Able to lie down & sit up w/o help. Negative SLR bilaterally Vascular: Carotid, radial artery, dorsalis pedis and  posterior tibial pulses are full and equal. No bruits present. Neurologic: Alert and oriented x3. Deep tendon reflexes symmetrical and normal.     Skin: Intact without suspicious lesions or rashes. Leaf nevus L lower back Lymph: No cervical, axillary lymphadenopathy present. Psych: Mood and affect are normal. Normally interactive  Assessment & Plan:  #1 Symptomatic Bunion

## 2012-08-27 ENCOUNTER — Encounter (HOSPITAL_BASED_OUTPATIENT_CLINIC_OR_DEPARTMENT_OTHER): Payer: Self-pay | Admitting: Podiatry

## 2012-08-27 NOTE — H&P (Signed)
  Patient has a history of chronic left foot pain and pain over the fifth metatarsal.  He is requesting surgery at this time.  He saw his primary care physician for preoperative H&P and clearance for elective surgery.

## 2012-08-28 ENCOUNTER — Encounter (HOSPITAL_BASED_OUTPATIENT_CLINIC_OR_DEPARTMENT_OTHER): Payer: Self-pay | Admitting: *Deleted

## 2012-08-28 NOTE — Progress Notes (Signed)
To [facility] at 0600-Hg on arrival-Npo after Mn-will bring ventolin inhaler,instructed to bring contact case/solution.

## 2012-08-30 ENCOUNTER — Other Ambulatory Visit: Payer: Self-pay | Admitting: Internal Medicine

## 2012-08-30 NOTE — Anesthesia Preprocedure Evaluation (Signed)
Anesthesia Evaluation  Patient identified by MRN, date of birth, ID band Patient awake    Reviewed: Allergy & Precautions, H&P , NPO status , Patient's Chart, lab work & pertinent test results  Airway Mallampati: II TM Distance: >3 FB Neck ROM: full    Dental no notable dental hx.    Pulmonary neg pulmonary ROS,  breath sounds clear to auscultation  Pulmonary exam normal       Cardiovascular Exercise Tolerance: Good negative cardio ROS  Rhythm:regular Rate:Normal     Neuro/Psych negative neurological ROS  negative psych ROS   GI/Hepatic negative GI ROS, Neg liver ROS,   Endo/Other  negative endocrine ROS  Renal/GU negative Renal ROS  negative genitourinary   Musculoskeletal   Abdominal   Peds  Hematology negative hematology ROS (+)   Anesthesia Other Findings   Reproductive/Obstetrics negative OB ROS                           Anesthesia Physical Anesthesia Plan  ASA: I  Anesthesia Plan: MAC   Post-op Pain Management:    Induction:   Airway Management Planned: Simple Face Mask  Additional Equipment:   Intra-op Plan:   Post-operative Plan:   Informed Consent: I have reviewed the patients History and Physical, chart, labs and discussed the procedure including the risks, benefits and alternatives for the proposed anesthesia with the patient or authorized representative who has indicated his/her understanding and acceptance.   Dental Advisory Given  Plan Discussed with: CRNA and Surgeon  Anesthesia Plan Comments:         Anesthesia Quick Evaluation

## 2012-08-31 ENCOUNTER — Encounter (HOSPITAL_BASED_OUTPATIENT_CLINIC_OR_DEPARTMENT_OTHER)
Admission: RE | Disposition: A | Discharge status: Home / Self Care | Payer: Self-pay | Source: Ambulatory Visit | Attending: Podiatry

## 2012-08-31 ENCOUNTER — Encounter (HOSPITAL_BASED_OUTPATIENT_CLINIC_OR_DEPARTMENT_OTHER): Payer: Self-pay | Admitting: *Deleted

## 2012-08-31 ENCOUNTER — Ambulatory Visit (HOSPITAL_BASED_OUTPATIENT_CLINIC_OR_DEPARTMENT_OTHER)
Admission: RE | Admit: 2012-08-31 | Discharge: 2012-08-31 | Disposition: A | Discharge status: Home / Self Care | Payer: 59 | Source: Ambulatory Visit | Attending: Podiatry | Admitting: Podiatry

## 2012-08-31 ENCOUNTER — Ambulatory Visit (HOSPITAL_BASED_OUTPATIENT_CLINIC_OR_DEPARTMENT_OTHER): Payer: 59 | Admitting: Anesthesiology

## 2012-08-31 ENCOUNTER — Encounter (HOSPITAL_BASED_OUTPATIENT_CLINIC_OR_DEPARTMENT_OTHER): Payer: Self-pay | Admitting: Anesthesiology

## 2012-08-31 DIAGNOSIS — M21612 Bunion of left foot: Secondary | ICD-10-CM

## 2012-08-31 DIAGNOSIS — M21619 Bunion of unspecified foot: Secondary | ICD-10-CM | POA: Insufficient documentation

## 2012-08-31 HISTORY — DX: Accidental discharge from unspecified firearms or gun, initial encounter: W34.00XA

## 2012-08-31 HISTORY — PX: METATARSAL OSTEOTOMY: SHX1641

## 2012-08-31 HISTORY — DX: Puncture wound without foreign body, unspecified lower leg, initial encounter: S81.839A

## 2012-08-31 LAB — POCT HEMOGLOBIN-HEMACUE: Hemoglobin: 15.7 g/dL (ref 13.0–17.0)

## 2012-08-31 SURGERY — OSTEOTOMY, METATARSAL BONE
Anesthesia: Monitor Anesthesia Care | Site: Foot | Laterality: Left | Wound class: Clean

## 2012-08-31 MED ORDER — LIDOCAINE HCL 1 % IJ SOLN
INTRAMUSCULAR | Status: DC | PRN
  Administered 2012-08-31: 4 mL

## 2012-08-31 MED ORDER — PROPOFOL 10 MG/ML IV EMUL
INTRAVENOUS | Status: DC | PRN
  Administered 2012-08-31: 50 ug/kg/min via INTRAVENOUS

## 2012-08-31 MED ORDER — LACTATED RINGERS IV SOLN
INTRAVENOUS | Status: DC | PRN
  Administered 2012-08-31: 07:00:00 via INTRAVENOUS

## 2012-08-31 MED ORDER — ROPIVACAINE HCL 5 MG/ML IJ SOLN
INTRAMUSCULAR | Status: DC | PRN
  Administered 2012-08-31: 30 mL

## 2012-08-31 MED ORDER — MIDAZOLAM HCL 5 MG/5ML IJ SOLN
INTRAMUSCULAR | Status: DC | PRN
  Administered 2012-08-31 (×2): 1 mg via INTRAVENOUS

## 2012-08-31 MED ORDER — BUPIVACAINE HCL 0.5 % IJ SOLN
INTRAMUSCULAR | Status: DC | PRN
  Administered 2012-08-31: 4 mL

## 2012-08-31 MED ORDER — ONDANSETRON HCL 4 MG/2ML IJ SOLN
INTRAMUSCULAR | Status: DC | PRN
  Administered 2012-08-31: 4 mg via INTRAVENOUS

## 2012-08-31 MED ORDER — FENTANYL CITRATE 0.05 MG/ML IJ SOLN
50.0000 ug | INTRAMUSCULAR | Status: DC | PRN
  Administered 2012-08-31: 50 ug via INTRAVENOUS
  Filled 2012-08-31: qty 1

## 2012-08-31 MED ORDER — CEFAZOLIN SODIUM-DEXTROSE 2-3 GM-% IV SOLR
2.0000 g | INTRAVENOUS | Status: DC
  Filled 2012-08-31: qty 50

## 2012-08-31 MED ORDER — KETOROLAC TROMETHAMINE 30 MG/ML IJ SOLN
INTRAMUSCULAR | Status: DC | PRN
  Administered 2012-08-31: 30 mg via INTRAVENOUS

## 2012-08-31 MED ORDER — SODIUM CHLORIDE 0.9 % IR SOLN
Status: DC | PRN
  Administered 2012-08-31: 08:00:00

## 2012-08-31 MED ORDER — LACTATED RINGERS IV SOLN
INTRAVENOUS | Status: DC
  Administered 2012-08-31: 09:00:00 via INTRAVENOUS
  Administered 2012-08-31: 100 mL/h via INTRAVENOUS
  Filled 2012-08-31: qty 1000

## 2012-08-31 MED ORDER — KETOROLAC TROMETHAMINE 30 MG/ML IJ SOLN
30.0000 mg | Freq: Once | INTRAMUSCULAR | Status: DC
  Filled 2012-08-31: qty 1

## 2012-08-31 MED ORDER — FENTANYL CITRATE 0.05 MG/ML IJ SOLN
25.0000 ug | INTRAMUSCULAR | Status: DC | PRN
  Filled 2012-08-31: qty 1

## 2012-08-31 MED ORDER — DEXAMETHASONE SODIUM PHOSPHATE 10 MG/ML IJ SOLN
INTRAMUSCULAR | Status: DC | PRN
  Administered 2012-08-31: 10 mg via INTRAVENOUS

## 2012-08-31 MED ORDER — LACTATED RINGERS IV SOLN
INTRAVENOUS | Status: DC
  Filled 2012-08-31: qty 1000

## 2012-08-31 MED ORDER — MIDAZOLAM HCL 2 MG/2ML IJ SOLN
1.0000 mg | INTRAMUSCULAR | Status: DC | PRN
  Administered 2012-08-31: 3 mg via INTRAVENOUS
  Filled 2012-08-31: qty 1

## 2012-08-31 SURGICAL SUPPLY — 47 items
BANDAGE COBAN STERILE 2 (GAUZE/BANDAGES/DRESSINGS) ×1 IMPLANT
BANDAGE CONFORM 3  STR LF (GAUZE/BANDAGES/DRESSINGS) ×2 IMPLANT
BLADE AVERAGE 25X9 (BLADE) ×2 IMPLANT
BLADE SURG 15 STRL LF DISP TIS (BLADE) ×1 IMPLANT
BLADE SURG 15 STRL SS (BLADE) ×2
BNDG CMPR 9X4 STRL LF SNTH (GAUZE/BANDAGES/DRESSINGS) ×1
BNDG COHESIVE 3X5 TAN STRL LF (GAUZE/BANDAGES/DRESSINGS) ×2 IMPLANT
BNDG ESMARK 4X9 LF (GAUZE/BANDAGES/DRESSINGS) ×2 IMPLANT
CLOTH BEACON ORANGE TIMEOUT ST (SAFETY) ×2 IMPLANT
COVER TABLE BACK 60X90 (DRAPES) ×2 IMPLANT
CUFF TOURN SGL QUICK 18 (TOURNIQUET CUFF) ×1 IMPLANT
DRAPE EXTREMITY T 121X128X90 (DRAPE) ×2 IMPLANT
DRAPE LG THREE QUARTER DISP (DRAPES) ×2 IMPLANT
DRAPE OEC MINIVIEW 54X84 (DRAPES) ×2 IMPLANT
DRSG XEROFORM 1X8 (GAUZE/BANDAGES/DRESSINGS) ×1 IMPLANT
ELECT REM PT RETURN 9FT ADLT (ELECTROSURGICAL) ×2
ELECTRODE REM PT RTRN 9FT ADLT (ELECTROSURGICAL) ×1 IMPLANT
GAUZE XEROFORM 1X8 LF (GAUZE/BANDAGES/DRESSINGS) IMPLANT
GLOVE BIOGEL M STER SZ 6 (GLOVE) ×1 IMPLANT
GLOVE ECLIPSE 6.0 STRL STRAW (GLOVE) ×1 IMPLANT
GLOVE ECLIPSE 6.5 STRL STRAW (GLOVE) ×1 IMPLANT
GLOVE INDICATOR 6.5 STRL GRN (GLOVE) ×1 IMPLANT
GLOVE SURG SS PI 8.0 STRL IVOR (GLOVE) ×2 IMPLANT
GOWN PREVENTION PLUS LG XLONG (DISPOSABLE) ×4 IMPLANT
GOWN STRL NON-REIN LRG LVL3 (GOWN DISPOSABLE) ×2 IMPLANT
NDL HYPO 25X1 1.5 SAFETY (NEEDLE) IMPLANT
NEEDLE HYPO 25X1 1.5 SAFETY (NEEDLE) IMPLANT
NS IRRIG 500ML POUR BTL (IV SOLUTION) ×2 IMPLANT
PACK BASIN DAY SURGERY FS (CUSTOM PROCEDURE TRAY) ×2 IMPLANT
PAD CAST 4YDX4 CTTN HI CHSV (CAST SUPPLIES) IMPLANT
PADDING CAST ABS 4INX4YD NS (CAST SUPPLIES) ×1
PADDING CAST ABS COTTON 4X4 ST (CAST SUPPLIES) ×1 IMPLANT
PADDING CAST COTTON 4X4 STRL (CAST SUPPLIES) ×2
PENCIL BUTTON HOLSTER BLD 10FT (ELECTRODE) ×2 IMPLANT
SCREW QUICK FIX 2X12 (Screw) ×1 IMPLANT
SPONGE GAUZE 4X4 12PLY (GAUZE/BANDAGES/DRESSINGS) ×2 IMPLANT
STOCKINETTE 4X48 STRL (DRAPES) ×2 IMPLANT
SUCTION FRAZIER TIP 10 FR DISP (SUCTIONS) IMPLANT
SUT ETHILON 4 0 PS 2 18 (SUTURE) ×2 IMPLANT
SUT VIC AB 4-0 RB1 27 (SUTURE) ×2
SUT VIC AB 4-0 RB1 27X BRD (SUTURE) IMPLANT
SYR BULB 3OZ (MISCELLANEOUS) ×2 IMPLANT
SYR CONTROL 10ML LL (SYRINGE) ×2 IMPLANT
TOWEL OR 17X24 6PK STRL BLUE (TOWEL DISPOSABLE) ×5 IMPLANT
TUBE CONNECTING 12X1/4 (SUCTIONS) IMPLANT
UNDERPAD 30X30 INCONTINENT (UNDERPADS AND DIAPERS) ×2 IMPLANT
WATER STERILE IRR 500ML POUR (IV SOLUTION) ×2 IMPLANT

## 2012-08-31 NOTE — Transfer of Care (Signed)
Immediate Anesthesia Transfer of Care Note  Patient: Gilbert Perkins  Procedure(s) Performed: Procedure(s) (LRB): METATARSAL OSTEOTOMY, LEFT FIFTH (Left)  Patient Location: PACU  Anesthesia Type: MAC  Level of Consciousness: awake, sedated, patient cooperative and responds to stimulation  Airway & Oxygen Therapy: Patient Spontanous Breathing and Patient connected to face mask oxygen  Post-op Assessment: Report given to PACU RN, Post -op Vital signs reviewed and stable and Patient moving all extremities  Post vital signs: Reviewed and stable  Complications: No apparent anesthesia complications

## 2012-08-31 NOTE — Anesthesia Postprocedure Evaluation (Signed)
  Anesthesia Post-op Note  Patient: Gilbert Perkins  Procedure(s) Performed: Procedure(s) (LRB): METATARSAL OSTEOTOMY, LEFT FIFTH (Left)  Patient Location: PACU  Anesthesia Type: MAC combined with regional for post-op pain  Level of Consciousness: awake and alert   Airway and Oxygen Therapy: Patient Spontanous Breathing  Post-op Pain: mild  Post-op Assessment: Post-op Vital signs reviewed, Patient's Cardiovascular Status Stable, Respiratory Function Stable, Patent Airway and No signs of Nausea or vomiting  Last Vitals:  Filed Vitals:   08/31/12 0845  BP: 113/73  Pulse:   Temp:   Resp:     Post-op Vital Signs: stable   Complications: No apparent anesthesia complications

## 2012-08-31 NOTE — Addendum Note (Signed)
Addendum created 08/31/12 1056 by Gaetano Hawthorne, MD   Modules edited: Anesthesia Blocks and Procedures, Anesthesia Medication Administration, Clinical Notes   Clinical Notes:  File: [id number]

## 2012-08-31 NOTE — Anesthesia Procedure Notes (Addendum)
Anesthesia Regional Block:  Popliteal block  Pre-Anesthetic Checklist: ,, timeout performed, Correct Patient, Correct Site, Correct Laterality, Correct Procedure, Correct Position, site marked, Risks and benefits discussed,  Surgical consent,  Pre-op evaluation,  At surgeon's request and post-op pain management  Laterality: Left  Prep: chloraprep       Needles:  Injection technique: Single-shot  Needle Type: Echogenic Stimulator Needle          Additional Needles:  Procedures: ultrasound guided (picture in chart) Popliteal block Narrative:  Start time: 08/31/2012 7:20 AM End time: 08/31/2012 7:30 AM Injection made incrementally with aspirations every 5 mL.  Performed by: Personally  Anesthesiologist: Ronelle Nigh MD  Additional Notes: No problems or complications noted

## 2012-08-31 NOTE — Op Note (Signed)
08/31/2012  8:19 AM  PATIENT:  Gilbert Perkins  42 y.o. male  PRE-OPERATIVE DIAGNOSIS:  BUNION  POST-OPERATIVE DIAGNOSIS:  BUNION  PROCEDURE:  Procedure(s): METATARSAL OSTEOTOMY, LEFT FIFTH (Left)  SURGEON:  Surgeon(s) and Role:    * Sherin Quarry, DPM - Primary  PHYSICIAN ASSISTANT:   ASSISTANTS: none   ANESTHESIA:   local, regional and MAC  EBL:   Minimal  BLOOD ADMINISTERED:none  DRAINS: none   LOCAL MEDICATIONS USED:  MARCAINE    and XYLOCAINE   SPECIMEN:  No Specimen  DISPOSITION OF SPECIMEN:  N/A  COUNTS:  YES  TOURNIQUET:   Total Tourniquet Time Documented: area (Left) - 17 minutes Total: area (Left) - 17 minutes   DICTATION: .Reubin Milan Dictation  PLAN OF CARE: Discharge to home after PACU  PATIENT DISPOSITION:  PACU - hemodynamically stable.   Delay start of Pharmacological VTE agent (>24hrs) due to surgical blood loss or risk of bleeding: not applicable  SURGICAL INDICATIONS:  Patient is here for surgical intervention and surgery is further discussed today based on our in-office consultation.  All questions were answered and consent is signed and in the chart outlining risks versus benefits.  The surgical site is marked today and I reviewed the planned procedure(s) with the patient today in preoperative holding area.  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 We will plan on a fifth metatarsal osteotomy.  PROCEDURES PERFORMED:  Patient is transported to the operating room and the surgical site is prepped and draped in the usual sterile manner.  Ankle tourniquet is inflated to 250 mm mercury. Incision is made over the dorsal lateral aspect of the fifth metatarsal. Deepened through sharp and blunt dissection. The periosteum is reflected off the bone exposing the neck of the fifth metatarsal. Using a sagittal saw, a through and through osteotomy was made from dorsal distal to plantar proximal angle to about 60.  Capital fragment shifted over medially to correct the deformity and bone clamp was applied. Bone was cut flush with the metatarsal head. Fixated using a snap off 12 mm x 2 mm screw with good compression of the osteotomy. Xi-scan confirmed good alignment. Wound is flushed with sterile antibiotic solution. Closed with 4-0 Vicryl and 4-0 nylon. Xeroform gauze wet-to-dry dressing is applied. The tourniquet is deflated noting good capillary refill time. A Jones compression bandage applied.  Patient returns to PACU having tolerated the procedure and anesthesia well.  Patient is given written and oral post op instructions.  Patient will follow up in my office as scheduled for a post op appointment.

## 2012-09-01 ENCOUNTER — Encounter (HOSPITAL_BASED_OUTPATIENT_CLINIC_OR_DEPARTMENT_OTHER): Payer: Self-pay | Admitting: Podiatry

## 2012-10-01 ENCOUNTER — Telehealth: Payer: Self-pay | Admitting: *Deleted

## 2012-10-01 MED ORDER — ALPRAZOLAM 0.5 MG PO TABS: ORAL_TABLET | ORAL | Status: DC

## 2012-10-01 NOTE — Telephone Encounter (Signed)
OK #30 

## 2012-10-01 NOTE — Telephone Encounter (Signed)
Last OV 08-17-12, last filled 08-30-12 #30

## 2012-10-01 NOTE — Telephone Encounter (Signed)
Rx sent 

## 2012-11-05 ENCOUNTER — Ambulatory Visit (INDEPENDENT_AMBULATORY_CARE_PROVIDER_SITE_OTHER): Payer: 59 | Admitting: Psychology

## 2012-11-05 DIAGNOSIS — F4323 Adjustment disorder with mixed anxiety and depressed mood: Secondary | ICD-10-CM

## 2012-11-16 ENCOUNTER — Telehealth: Payer: Self-pay | Admitting: *Deleted

## 2012-11-16 NOTE — Telephone Encounter (Signed)
Last ov 08-17-12,last filled 10-01-12 #30

## 2012-11-16 NOTE — Telephone Encounter (Signed)
OK # 30 prn only

## 2012-11-17 ENCOUNTER — Other Ambulatory Visit: Payer: Self-pay | Admitting: Internal Medicine

## 2012-11-17 MED ORDER — ALPRAZOLAM 0.5 MG PO TABS: ORAL_TABLET | ORAL | Status: DC

## 2012-11-17 NOTE — Telephone Encounter (Signed)
Rx sent 

## 2012-11-19 ENCOUNTER — Ambulatory Visit (INDEPENDENT_AMBULATORY_CARE_PROVIDER_SITE_OTHER): Payer: 59 | Admitting: Psychology

## 2012-11-19 DIAGNOSIS — F4323 Adjustment disorder with mixed anxiety and depressed mood: Secondary | ICD-10-CM

## 2012-11-20 ENCOUNTER — Telehealth: Payer: Self-pay | Admitting: *Deleted

## 2012-11-20 NOTE — Telephone Encounter (Signed)
Pharmacy is requesting a refill for valacyclovir 1 gm.  Last ov 08/17/12 Last fill date 08/30/12 90 ct please advise.

## 2012-12-11 ENCOUNTER — Ambulatory Visit (INDEPENDENT_AMBULATORY_CARE_PROVIDER_SITE_OTHER): Payer: 59 | Admitting: Psychology

## 2012-12-11 DIAGNOSIS — F4323 Adjustment disorder with mixed anxiety and depressed mood: Secondary | ICD-10-CM

## 2012-12-18 ENCOUNTER — Telehealth: Payer: Self-pay | Admitting: *Deleted

## 2012-12-18 MED ORDER — ALPRAZOLAM 0.5 MG PO TABS: ORAL_TABLET | ORAL | Status: DC

## 2012-12-18 NOTE — Telephone Encounter (Signed)
Rx refilled for #30 alprazolam . AG cma

## 2012-12-18 NOTE — Telephone Encounter (Signed)
OK #30 

## 2012-12-18 NOTE — Telephone Encounter (Signed)
Pharmacy is requesting a refill for alprazolam 0.5 mg  Last ov 08/17/12 last date filled  # 30 0 R patient has not UDS on file , but does have a controlled substance contract.  Ag cma

## 2013-01-08 ENCOUNTER — Telehealth: Payer: Self-pay

## 2013-01-08 MED ORDER — VALACYCLOVIR HCL 1 G PO TABS: ORAL_TABLET | ORAL | Status: DC

## 2013-01-08 NOTE — Telephone Encounter (Signed)
New Rx sent.

## 2013-02-03 ENCOUNTER — Telehealth: Payer: Self-pay | Admitting: *Deleted

## 2013-02-03 MED ORDER — ALPRAZOLAM 0.5 MG PO TABS: ORAL_TABLET | ORAL | Status: DC

## 2013-02-03 NOTE — Telephone Encounter (Signed)
ALPRAZolam (XANAX) 0.5 MG tablet Last OV: 08/17/12 Last Refill: 12/18/12 Last UDS: 04/27/2012       Low risk

## 2013-02-03 NOTE — Telephone Encounter (Signed)
OK X1 

## 2013-02-03 NOTE — Telephone Encounter (Signed)
Med refilled, awaiting signature

## 2013-02-05 ENCOUNTER — Telehealth: Payer: Self-pay | Admitting: *Deleted

## 2013-02-08 NOTE — Telephone Encounter (Signed)
Error

## 2013-03-05 ENCOUNTER — Ambulatory Visit (INDEPENDENT_AMBULATORY_CARE_PROVIDER_SITE_OTHER): Payer: 59 | Admitting: Internal Medicine

## 2013-03-05 ENCOUNTER — Encounter: Payer: Self-pay | Admitting: Internal Medicine

## 2013-03-05 VITALS — BP 124/81 | HR 94 | Temp 98.8°F | Wt 196.4 lb

## 2013-03-05 DIAGNOSIS — K439 Ventral hernia without obstruction or gangrene: Secondary | ICD-10-CM

## 2013-03-05 DIAGNOSIS — E782 Mixed hyperlipidemia: Secondary | ICD-10-CM

## 2013-03-05 NOTE — Progress Notes (Signed)
  Subjective:    Patient ID: Gilbert Perkins, male    DOB: 1971/03/09, 42 y.o.   MRN: [id number]  HPI  He noted a lump in the epigastric area this week incidentally. It is tender to palpation & worse with sit ups . He has no other associated symptoms.    Review of Systems   He denies fever, chills, or sweats. He denies epistaxis, hemoptysis, hematuria, melena, or rectal bleeding.He has lost 48 # with portion control and high level exercise. Specifically he walks 5-6 miles per day over 90 minutes in addition to aerobic exercise. His waist measurement has dropped from 38 inches to 34 or less. There's been no unexplained weight loss, dysphagia, or abdominal pain. He has no abnormal bruising or bleeding. He has no difficulty stopping bleeding with injury.     Objective:   Physical Exam General appearance is one of good health and nourishment w/o distress.  Eyes: No conjunctival inflammation or scleral icterus is present.  Oral exam: Dental hygiene is good; lips and gums are healthy appearing.There is no oropharyngeal erythema or exudate noted.   Heart:  Normal rate and regular rhythm. S1 and S2 normal without gallop, murmur, click, rub or other extra sounds     Lungs:Chest clear to auscultation; no wheezes, rhonchi,rales ,or rubs present.No increased work of breathing.   Abdomen: bowel sounds normal, soft without masses or organomegaly . He has a reducible 2 x 2 cm ventral hernia above the umbilicus.  No guarding or rebound .  Musculoskeletal: Able to lie flat and sit up without help. Skin:Warm & dry.  Intact without suspicious lesions or rashes ; no jaundice or tenting  Lymphatic: No lymphadenopathy is noted about the head, neck, axilla.                Assessment & Plan:  #1 supraumbilical ventral hernia which is reducible. This was most likely related to his prior access central weight accumulation.  #2 weight loss related to therapeutic life changes.  #3 past history of  mixed hyperlipidemia  Plan: Risk and options discussed.  NMR LipoProfile recommended to assess long-term risk related to the lipids

## 2013-03-05 NOTE — Patient Instructions (Signed)
Your next office appointment will be determined based upon review of your pending labs . Those instructions will be transmitted to you through My Chart  or by mail if you're not using this system.   Please report any significant change in your symptoms.

## 2013-03-08 ENCOUNTER — Other Ambulatory Visit: Payer: 59

## 2013-03-08 DIAGNOSIS — E782 Mixed hyperlipidemia: Secondary | ICD-10-CM

## 2013-03-09 ENCOUNTER — Telehealth: Payer: Self-pay | Admitting: *Deleted

## 2013-03-09 ENCOUNTER — Other Ambulatory Visit: Payer: Self-pay | Admitting: Internal Medicine

## 2013-03-09 NOTE — Telephone Encounter (Signed)
OK X1 

## 2013-03-09 NOTE — Telephone Encounter (Signed)
ALPRAZolam (XANAX) 0.5 MG tablet Last refill: 02/03/2013,  Last OV: 03/05/2013 UDS Due: Low risk

## 2013-03-10 ENCOUNTER — Encounter: Payer: Self-pay | Admitting: Internal Medicine

## 2013-03-10 LAB — NMR LIPOPROFILE WITH LIPIDS
Cholesterol, Total: 215 mg/dL — ABNORMAL HIGH (ref <200)
HDL Particle Number: 28.7 umol/L — ABNORMAL LOW (ref >=30.5)
HDL Size: 8.3 nm — ABNORMAL LOW (ref >=9.2)
HDL-C: 42 mg/dL (ref >=40)
LDL (calc): 144 mg/dL — ABNORMAL HIGH (ref <100)
LDL Particle Number: 1889 nmol/L — ABNORMAL HIGH (ref <1000)
LDL Size: 20.9 nm (ref >20.5)
LP-IR Score: 64 — ABNORMAL HIGH (ref <=45)
Large HDL-P: 1.5 umol/L — ABNORMAL LOW (ref >=4.8)
Large VLDL-P: 2.4 nmol/L (ref <=2.7)
Small LDL Particle Number: 613 nmol/L — ABNORMAL HIGH (ref <=527)
Triglycerides: 143 mg/dL (ref <150)
VLDL Size: 45.3 nm (ref <=46.6)

## 2013-03-11 ENCOUNTER — Encounter: Payer: Self-pay | Admitting: *Deleted

## 2013-03-11 NOTE — Progress Notes (Signed)
Letter mailed to patient.

## 2013-03-15 ENCOUNTER — Telehealth: Payer: Self-pay | Admitting: *Deleted

## 2013-03-15 ENCOUNTER — Other Ambulatory Visit: Payer: Self-pay | Admitting: Internal Medicine

## 2013-03-15 ENCOUNTER — Telehealth: Payer: Self-pay | Admitting: Internal Medicine

## 2013-03-15 ENCOUNTER — Encounter: Payer: Self-pay | Admitting: Internal Medicine

## 2013-03-15 DIAGNOSIS — K439 Ventral hernia without obstruction or gangrene: Secondary | ICD-10-CM

## 2013-03-15 DIAGNOSIS — E785 Hyperlipidemia, unspecified: Secondary | ICD-10-CM

## 2013-03-15 MED ORDER — ATORVASTATIN CALCIUM 20 MG PO TABS: 20.0000 mg | ORAL_TABLET | Freq: Every day | ORAL | Status: DC

## 2013-03-15 NOTE — Telephone Encounter (Signed)
Please advise 

## 2013-03-15 NOTE — Telephone Encounter (Signed)
Ok X1 

## 2013-03-15 NOTE — Telephone Encounter (Addendum)
Patient received his cholesterol results in the mail and states that he is fine with exercising to help improve but has an issue with starting on a diet right now. He states that he will work on dieting but wants to know if Dr. Alwyn Ren wants to go ahead and rx a medication to help cholesterol. Please advise.   Also, patient states that he is interested in a referral for his hernia. Please advise.

## 2013-03-15 NOTE — Telephone Encounter (Signed)
ALPRAZolam (XANAX) 0.5 MG tablet  Sig: TAKE 1 TABLET BY MOUTH THREE TIMES DAILY AS NEEDED ONLY FOR ANXIETY Last Refill: 02/03/2013 #30 Last OV: 03/05/2013 Last UDS: 04/27/2012 Low risk

## 2013-03-23 ENCOUNTER — Other Ambulatory Visit: Payer: Self-pay | Admitting: Internal Medicine

## 2013-03-25 ENCOUNTER — Ambulatory Visit (INDEPENDENT_AMBULATORY_CARE_PROVIDER_SITE_OTHER): Payer: 59 | Admitting: General Surgery

## 2013-03-25 ENCOUNTER — Encounter (INDEPENDENT_AMBULATORY_CARE_PROVIDER_SITE_OTHER): Payer: Self-pay | Admitting: General Surgery

## 2013-03-25 VITALS — BP 122/78 | HR 88 | Ht 71.0 in

## 2013-03-25 DIAGNOSIS — K439 Ventral hernia without obstruction or gangrene: Secondary | ICD-10-CM

## 2013-03-25 DIAGNOSIS — Z01818 Encounter for other preprocedural examination: Secondary | ICD-10-CM

## 2013-03-25 NOTE — Patient Instructions (Signed)
Ventral Hernia A ventral hernia (also called an incisional hernia) is a hernia that occurs at the site of a previous surgical cut (incision) in the abdomen. The abdominal wall spans from your lower chest down to your pelvis. If the abdominal wall is weakened from a surgical incision, a hernia can occur. A hernia is a bulge of bowel or muscle tissue pushing out on the weakened part of the abdominal wall. Ventral hernias can get bigger from straining or lifting. Obese and older people are at higher risk for a ventral hernia. People who develop infections after surgery or require repeat incisions at the same site on the abdomen are also at increased risk. CAUSES  A ventral hernia occurs because of weakness in the abdominal wall at an incision site.  SYMPTOMS  Common symptoms include:  A visible bulge or lump on the abdominal wall.  Pain or tenderness around the lump.  Increased discomfort if you cough or make a sudden movement. If the hernia has blocked part of the intestine, a serious complication can occur (incarcerated or strangulated hernia). This can become a problem that requires emergency surgery because the blood flow to the blocked intestine may be cut off. Symptoms may include:  Feeling sick to your stomach (nauseous).  Throwing up (vomiting).  Stomach swelling (distention) or bloating.  Fever.  Rapid heartbeat. DIAGNOSIS  Your caregiver will take a medical history and perform a physical exam. Various tests may be ordered, such as:  Blood tests.  Urine tests.  Ultrasonography.  X-rays.  Computed tomography (CT). TREATMENT  Watchful waiting may be all that is needed for a smaller hernia that does not cause symptoms. Your caregiver may recommend the use of a supportive belt (truss) that helps to keep the abdominal wall intact. For larger hernias or those that cause pain, surgery to repair the hernia is usually recommended. If a hernia becomes strangulated, emergency surgery  needs to be done right away. HOME CARE INSTRUCTIONS  Avoid putting pressure or strain on the abdominal area.  Avoid heavy lifting.  Use good body positioning for physical tasks. Ask your caregiver about proper body positioning.  Use a supportive belt as directed by your caregiver.  Maintain a healthy weight.  Eat foods that are high in fiber, such as whole grains, fruits, and vegetables. Fiber helps prevent difficult bowel movements (constipation).  Drink enough fluids to keep your urine clear or pale yellow.  Follow up with your caregiver as directed. SEEK MEDICAL CARE IF:   Your hernia seems to be getting larger or more painful. SEEK IMMEDIATE MEDICAL CARE IF:   You have abdominal pain that is sudden and sharp.  Your pain becomes severe.  You have repeated vomiting.  You are sweating a lot.  You notice a rapid heartbeat.  You develop a fever. MAKE SURE YOU:   Understand these instructions.  Will watch your condition.  Will get help right away if you are not doing well or get worse. Document Released: 04/08/2012 Document Reviewed: 04/08/2012 [employer] Patient Information 2014 [employer].  

## 2013-03-27 LAB — NASAL CULTURE (N/P): Organism ID, Bacteria: NORMAL

## 2013-03-29 MED ORDER — VALACYCLOVIR HCL 1 G PO TABS: ORAL_TABLET | ORAL | Status: DC

## 2013-03-29 MED ORDER — ALPRAZOLAM 0.5 MG PO TABS: ORAL_TABLET | ORAL | Status: DC

## 2013-03-29 NOTE — Telephone Encounter (Signed)
Alprazolam faxed to pharmacy. Valtrex refilled as well

## 2013-03-30 ENCOUNTER — Encounter (INDEPENDENT_AMBULATORY_CARE_PROVIDER_SITE_OTHER): Payer: Self-pay | Admitting: General Surgery
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
## 2013-04-07 ENCOUNTER — Encounter: Payer: Self-pay | Admitting: Internal Medicine

## 2013-04-07 ENCOUNTER — Encounter (HOSPITAL_COMMUNITY): Payer: Self-pay | Admitting: Pharmacy Technician

## 2013-04-09 ENCOUNTER — Encounter (HOSPITAL_COMMUNITY): Payer: Self-pay

## 2013-04-09 ENCOUNTER — Encounter (HOSPITAL_COMMUNITY)
Admission: RE | Admit: 2013-04-09 | Discharge: 2013-04-09 | Disposition: A | Discharge status: Home / Self Care | Payer: 59 | Source: Ambulatory Visit | Attending: General Surgery | Admitting: General Surgery

## 2013-04-09 DIAGNOSIS — Z01812 Encounter for preprocedural laboratory examination: Secondary | ICD-10-CM | POA: Insufficient documentation

## 2013-04-09 DIAGNOSIS — Z01818 Encounter for other preprocedural examination: Secondary | ICD-10-CM | POA: Insufficient documentation

## 2013-04-09 LAB — CBC WITH DIFFERENTIAL/PLATELET
Basophils Absolute: 0 10*3/uL (ref 0.0–0.1)
Basophils Relative: 0 % (ref 0–1)
Eosinophils Absolute: 0.2 10*3/uL (ref 0.0–0.7)
Eosinophils Relative: 2 % (ref 0–5)
HCT: 45.5 % (ref 39.0–52.0)
Hemoglobin: 15.9 g/dL (ref 13.0–17.0)
Lymphocytes Relative: 28 % (ref 12–46)
Lymphs Abs: 2.2 10*3/uL (ref 0.7–4.0)
MCH: 30.5 pg (ref 26.0–34.0)
MCHC: 34.9 g/dL (ref 30.0–36.0)
MCV: 87.2 fL (ref 78.0–100.0)
Monocytes Absolute: 0.6 10*3/uL (ref 0.1–1.0)
Monocytes Relative: 8 % (ref 3–12)
Neutro Abs: 4.9 10*3/uL (ref 1.7–7.7)
Neutrophils Relative %: 62 % (ref 43–77)
Platelets: 328 10*3/uL (ref 150–400)
RBC: 5.22 MIL/uL (ref 4.22–5.81)
RDW: 13.8 % (ref 11.5–15.5)
WBC: 7.9 10*3/uL (ref 4.0–10.5)

## 2013-04-09 LAB — BASIC METABOLIC PANEL
BUN: 14 mg/dL (ref 6–23)
CO2: 26 mEq/L (ref 19–32)
Calcium: 9.2 mg/dL (ref 8.4–10.5)
Chloride: 101 mEq/L (ref 96–112)
Creatinine, Ser: 1.12 mg/dL (ref 0.50–1.35)
GFR calc Af Amer: >90 mL/min (ref >90)
GFR calc non Af Amer: 78 mL/min — ABNORMAL LOW (ref >90)
Glucose, Bld: 108 mg/dL — ABNORMAL HIGH (ref 70–99)
Potassium: 4.4 mEq/L (ref 3.5–5.1)
Sodium: 138 mEq/L (ref 135–145)

## 2013-04-09 NOTE — Pre-Procedure Instructions (Signed)
Gilbert Perkins  04/09/2013   Your procedure is scheduled on:  Friday April 16, 2013.  Report to [facility] Short Stay Entrance "A" Admitting at 10:00 AM.  Call this number if you have problems the morning of surgery: [phone]   Remember:   Do not eat food or drink liquids after midnight.   Take these medicines the morning of surgery with A SIP OF WATER: Albuterol inhaler if needed for shortness of breath, Alprazolam (Xanax) if needed for anxiety, Valacyclovir (Valtrex)   Discontinue aspirin and herbal medications 5 days before your surgery   Do not wear jewelry.  Do not wear lotions, powders, or colognes. You may wear deodorant.  Men may shave face and neck.  Do not bring valuables to the hospital.  [facility] is not responsible for any belongings or valuables.               Contacts, dentures or bridgework may not be worn into surgery.  Leave suitcase in the car. After surgery it may be brought to your room.  For patients admitted to the hospital, discharge time is determined by your treatment team.               Patients discharged the day of surgery will not be allowed to drive home.  Name and phone number of your driver: Family/Friend  Special Instructions: Shower using CHG 2 nights before surgery and the night before surgery.  If you shower the day of surgery use CHG.  Use special wash - you have one bottle of CHG for all showers.  You should use approximately 1/3 of the bottle for each shower.   Please read over the following fact sheets that you were given: Pain Booklet, Coughing and Deep Breathing and Surgical Site Infection Prevention

## 2013-04-09 NOTE — Progress Notes (Signed)
Patient informed Nurse that he thought he had a stress test many years ago but was unsure. 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 Revonda Standard, PA notified to see if patient needed to have EKG and Chest Xray. Patient denied having any shortness of breath at this time. Revonda Standard, PA instructed Nurse to ask patient about blood sugar, and the last time he had eaten as it was noted in EPIC that patient was hyperglycemic. Patient stated that he consumed a steak and cheese sub and a Cheerwine soda five minutes before arrival to PAT. Revonda Standard, PA stated if glucose came back high then a EKG would need to be preformed DOS. Patient informed of this and verbalized understanding. PCP is Dr. [name].

## 2013-04-15 MED ORDER — CEFAZOLIN SODIUM-DEXTROSE 2-3 GM-% IV SOLR
2.0000 g | INTRAVENOUS | Status: AC
  Administered 2013-04-16: 2 g via INTRAVENOUS
  Filled 2013-04-15: qty 50

## 2013-04-15 NOTE — Progress Notes (Signed)
Nurse instructed patient to arrive at Short Stay at 0530 instead of 1000 tomorrow for surgery. Patient verbalized understanding.

## 2013-04-16 ENCOUNTER — Encounter (HOSPITAL_COMMUNITY): Payer: 59 | Admitting: Vascular Surgery

## 2013-04-16 ENCOUNTER — Ambulatory Visit (HOSPITAL_COMMUNITY): Payer: 59 | Admitting: Anesthesiology

## 2013-04-16 ENCOUNTER — Encounter (HOSPITAL_COMMUNITY)
Admission: RE | Disposition: A | Discharge status: Home / Self Care | Payer: Self-pay | Source: Ambulatory Visit | Attending: General Surgery

## 2013-04-16 ENCOUNTER — Encounter (HOSPITAL_COMMUNITY): Payer: Self-pay | Admitting: *Deleted

## 2013-04-16 ENCOUNTER — Observation Stay (HOSPITAL_COMMUNITY)
Admission: RE | Admit: 2013-04-16 | Discharge: 2013-04-17 | Disposition: A | Discharge status: Home / Self Care | Payer: 59 | Source: Ambulatory Visit | Attending: General Surgery | Admitting: General Surgery

## 2013-04-16 DIAGNOSIS — R7309 Other abnormal glucose: Secondary | ICD-10-CM | POA: Insufficient documentation

## 2013-04-16 DIAGNOSIS — K439 Ventral hernia without obstruction or gangrene: Principal | ICD-10-CM | POA: Insufficient documentation

## 2013-04-16 DIAGNOSIS — E785 Hyperlipidemia, unspecified: Secondary | ICD-10-CM | POA: Insufficient documentation

## 2013-04-16 DIAGNOSIS — Z87891 Personal history of nicotine dependence: Secondary | ICD-10-CM | POA: Insufficient documentation

## 2013-04-16 DIAGNOSIS — Z9852 Vasectomy status: Secondary | ICD-10-CM | POA: Insufficient documentation

## 2013-04-16 HISTORY — PX: VENTRAL HERNIA REPAIR: SHX424

## 2013-04-16 HISTORY — PX: INSERTION OF MESH: SHX5868

## 2013-04-16 SURGERY — REPAIR, HERNIA, VENTRAL
Anesthesia: General | Site: Abdomen

## 2013-04-16 MED ORDER — GLYCOPYRROLATE 0.2 MG/ML IJ SOLN
INTRAMUSCULAR | Status: DC | PRN
  Administered 2013-04-16: 0.2 mg via INTRAVENOUS
  Administered 2013-04-16: 0.4 mg via INTRAVENOUS

## 2013-04-16 MED ORDER — KETOROLAC TROMETHAMINE 30 MG/ML IJ SOLN
30.0000 mg | Freq: Four times a day (QID) | INTRAMUSCULAR | Status: DC
  Administered 2013-04-16 – 2013-04-17 (×4): 30 mg via INTRAVENOUS
  Filled 2013-04-16 (×5): qty 1

## 2013-04-16 MED ORDER — SODIUM CHLORIDE 0.9 % IV SOLN: 250.0000 mL | INTRAVENOUS | Status: DC | PRN

## 2013-04-16 MED ORDER — OXYCODONE HCL 5 MG PO TABS: 5.0000 mg | ORAL_TABLET | ORAL | Status: DC | PRN

## 2013-04-16 MED ORDER — BUPIVACAINE LIPOSOME 1.3 % IJ SUSP
20.0000 mL | INTRAMUSCULAR | Status: DC
  Filled 2013-04-16: qty 20

## 2013-04-16 MED ORDER — HYDROMORPHONE HCL PF 1 MG/ML IJ SOLN
0.2500 mg | INTRAMUSCULAR | Status: DC | PRN
  Administered 2013-04-16 (×4): 0.5 mg via INTRAVENOUS

## 2013-04-16 MED ORDER — SODIUM CHLORIDE 0.9 % IJ SOLN: 3.0000 mL | Freq: Two times a day (BID) | INTRAMUSCULAR | Status: DC

## 2013-04-16 MED ORDER — OXYCODONE-ACETAMINOPHEN 7.5-325 MG PO TABS: 1.0000 | ORAL_TABLET | ORAL | Status: DC | PRN

## 2013-04-16 MED ORDER — MIDAZOLAM HCL 5 MG/5ML IJ SOLN
INTRAMUSCULAR | Status: DC | PRN
  Administered 2013-04-16: 2 mg via INTRAVENOUS

## 2013-04-16 MED ORDER — PROPOFOL 10 MG/ML IV BOLUS
INTRAVENOUS | Status: DC | PRN
  Administered 2013-04-16: 200 mg via INTRAVENOUS

## 2013-04-16 MED ORDER — KETOROLAC TROMETHAMINE 30 MG/ML IJ SOLN
INTRAMUSCULAR | Status: DC | PRN
  Administered 2013-04-16: 30 mg via INTRAVENOUS

## 2013-04-16 MED ORDER — SODIUM CHLORIDE 0.9 % IJ SOLN: 3.0000 mL | INTRAMUSCULAR | Status: DC | PRN

## 2013-04-16 MED ORDER — KETOROLAC TROMETHAMINE 30 MG/ML IJ SOLN: 30.0000 mg | Freq: Four times a day (QID) | INTRAMUSCULAR | Status: DC

## 2013-04-16 MED ORDER — CHLORHEXIDINE GLUCONATE 4 % EX LIQD: 1.0000 "application " | Freq: Once | CUTANEOUS | Status: DC

## 2013-04-16 MED ORDER — ALPRAZOLAM 0.5 MG PO TABS
0.5000 mg | ORAL_TABLET | Freq: Every day | ORAL | Status: DC | PRN
  Administered 2013-04-16: 0.5 mg via ORAL
  Filled 2013-04-16: qty 1

## 2013-04-16 MED ORDER — PHENYLEPHRINE HCL 10 MG/ML IJ SOLN
INTRAMUSCULAR | Status: DC | PRN
  Administered 2013-04-16: 80 ug via INTRAVENOUS
  Administered 2013-04-16: 120 ug via INTRAVENOUS

## 2013-04-16 MED ORDER — SODIUM CHLORIDE 0.9 % IJ SOLN
INTRAMUSCULAR | Status: DC | PRN
  Administered 2013-04-16: 08:00:00

## 2013-04-16 MED ORDER — ACETAMINOPHEN 650 MG RE SUPP: 650.0000 mg | RECTAL | Status: DC | PRN

## 2013-04-16 MED ORDER — SUCCINYLCHOLINE CHLORIDE 20 MG/ML IJ SOLN
INTRAMUSCULAR | Status: DC | PRN
  Administered 2013-04-16: 100 mg via INTRAVENOUS

## 2013-04-16 MED ORDER — MORPHINE SULFATE 2 MG/ML IJ SOLN: 1.0000 mg | INTRAMUSCULAR | Status: DC | PRN

## 2013-04-16 MED ORDER — LIDOCAINE HCL (CARDIAC) 20 MG/ML IV SOLN
INTRAVENOUS | Status: DC | PRN
  Administered 2013-04-16: 70 mg via INTRAVENOUS

## 2013-04-16 MED ORDER — BUPIVACAINE-EPINEPHRINE (PF) 0.25% -1:200000 IJ SOLN
INTRAMUSCULAR | Status: AC
  Filled 2013-04-16: qty 30

## 2013-04-16 MED ORDER — POTASSIUM CHLORIDE IN NACL 20-0.9 MEQ/L-% IV SOLN
INTRAVENOUS | Status: DC
  Administered 2013-04-16: 18:00:00 via INTRAVENOUS
  Filled 2013-04-16 (×2): qty 1000

## 2013-04-16 MED ORDER — MORPHINE SULFATE 2 MG/ML IJ SOLN
1.0000 mg | INTRAMUSCULAR | Status: DC | PRN
  Administered 2013-04-16: 4 mg via INTRAVENOUS
  Filled 2013-04-16: qty 2

## 2013-04-16 MED ORDER — SODIUM CHLORIDE 0.9 % IJ SOLN
INTRAMUSCULAR | Status: AC
  Filled 2013-04-16: qty 40

## 2013-04-16 MED ORDER — ONDANSETRON HCL 4 MG/2ML IJ SOLN: 4.0000 mg | Freq: Once | INTRAMUSCULAR | Status: DC | PRN

## 2013-04-16 MED ORDER — ONDANSETRON HCL 4 MG PO TABS: 4.0000 mg | ORAL_TABLET | Freq: Four times a day (QID) | ORAL | Status: DC | PRN

## 2013-04-16 MED ORDER — EPHEDRINE SULFATE 50 MG/ML IJ SOLN
INTRAMUSCULAR | Status: DC | PRN
  Administered 2013-04-16: 10 mg via INTRAVENOUS
  Administered 2013-04-16: 20 mg via INTRAVENOUS

## 2013-04-16 MED ORDER — ONDANSETRON HCL 4 MG/2ML IJ SOLN
INTRAMUSCULAR | Status: DC | PRN
  Administered 2013-04-16: 4 mg via INTRAVENOUS

## 2013-04-16 MED ORDER — ENOXAPARIN SODIUM 40 MG/0.4ML ~~LOC~~ SOLN
40.0000 mg | SUBCUTANEOUS | Status: DC
  Administered 2013-04-17: 40 mg via SUBCUTANEOUS
  Filled 2013-04-16 (×2): qty 0.4

## 2013-04-16 MED ORDER — ONDANSETRON HCL 4 MG/2ML IJ SOLN: 4.0000 mg | Freq: Four times a day (QID) | INTRAMUSCULAR | Status: DC | PRN

## 2013-04-16 MED ORDER — ALBUTEROL SULFATE HFA 108 (90 BASE) MCG/ACT IN AERS
1.0000 | INHALATION_SPRAY | Freq: Four times a day (QID) | RESPIRATORY_TRACT | Status: DC | PRN
  Filled 2013-04-16: qty 6.7

## 2013-04-16 MED ORDER — SODIUM CHLORIDE 0.9 % IV SOLN
INTRAVENOUS | Status: DC
Start: 2013-04-16 — End: 2013-04-16

## 2013-04-16 MED ORDER — NEOSTIGMINE METHYLSULFATE 1 MG/ML IJ SOLN
INTRAMUSCULAR | Status: DC | PRN
  Administered 2013-04-16: 3 mg via INTRAVENOUS

## 2013-04-16 MED ORDER — 0.9 % SODIUM CHLORIDE (POUR BTL) OPTIME
TOPICAL | Status: DC | PRN
  Administered 2013-04-16: 1000 mL

## 2013-04-16 MED ORDER — HYDROMORPHONE HCL PF 1 MG/ML IJ SOLN
INTRAMUSCULAR | Status: AC
  Administered 2013-04-16: 0.5 mg via INTRAVENOUS
  Filled 2013-04-16: qty 1

## 2013-04-16 MED ORDER — FENTANYL CITRATE 0.05 MG/ML IJ SOLN
INTRAMUSCULAR | Status: DC | PRN
  Administered 2013-04-16: 50 ug via INTRAVENOUS
  Administered 2013-04-16: 100 ug via INTRAVENOUS
  Administered 2013-04-16: 50 ug via INTRAVENOUS

## 2013-04-16 MED ORDER — PANTOPRAZOLE SODIUM 40 MG PO TBEC
40.0000 mg | DELAYED_RELEASE_TABLET | Freq: Every day | ORAL | Status: DC
  Administered 2013-04-16 – 2013-04-17 (×2): 40 mg via ORAL
  Filled 2013-04-16 (×2): qty 1

## 2013-04-16 MED ORDER — OXYCODONE-ACETAMINOPHEN 5-325 MG PO TABS
1.0000 | ORAL_TABLET | ORAL | Status: DC | PRN
  Administered 2013-04-16 – 2013-04-17 (×3): 2 via ORAL
  Filled 2013-04-16 (×3): qty 2

## 2013-04-16 MED ORDER — LACTATED RINGERS IV SOLN
INTRAVENOUS | Status: DC | PRN
  Administered 2013-04-16 (×2): via INTRAVENOUS

## 2013-04-16 MED ORDER — ALBUTEROL SULFATE HFA 108 (90 BASE) MCG/ACT IN AERS
INHALATION_SPRAY | RESPIRATORY_TRACT | Status: DC | PRN
  Administered 2013-04-16: 4 via RESPIRATORY_TRACT

## 2013-04-16 MED ORDER — ACETAMINOPHEN 325 MG PO TABS: 650.0000 mg | ORAL_TABLET | ORAL | Status: DC | PRN

## 2013-04-16 MED ORDER — MORPHINE SULFATE 4 MG/ML IJ SOLN
INTRAMUSCULAR | Status: AC
  Administered 2013-04-16: 4 mg
  Filled 2013-04-16: qty 1

## 2013-04-16 MED ORDER — ROCURONIUM BROMIDE 100 MG/10ML IV SOLN
INTRAVENOUS | Status: DC | PRN
  Administered 2013-04-16: 30 mg via INTRAVENOUS

## 2013-04-16 SURGICAL SUPPLY — 51 items
ADH SKN CLS APL DERMABOND .7 (GAUZE/BANDAGES/DRESSINGS)
APL SKNCLS STERI-STRIP NONHPOA (GAUZE/BANDAGES/DRESSINGS) ×1
BENZOIN TINCTURE PRP APPL 2/3 (GAUZE/BANDAGES/DRESSINGS) ×1 IMPLANT
BLADE SURG ROTATE 9660 (MISCELLANEOUS) ×1 IMPLANT
CANISTER SUCTION 2500CC (MISCELLANEOUS) ×2 IMPLANT
CHLORAPREP W/TINT 26ML (MISCELLANEOUS) ×2 IMPLANT
COVER SURGICAL LIGHT HANDLE (MISCELLANEOUS) ×2 IMPLANT
DECANTER SPIKE VIAL GLASS SM (MISCELLANEOUS) ×2 IMPLANT
DERMABOND ADVANCED (GAUZE/BANDAGES/DRESSINGS)
DERMABOND ADVANCED .7 DNX12 (GAUZE/BANDAGES/DRESSINGS) IMPLANT
DRAPE LAPAROSCOPIC ABDOMINAL (DRAPES) ×2 IMPLANT
DRAPE UTILITY 15X26 W/TAPE STR (DRAPE) ×4 IMPLANT
DRSG OPSITE 6X11 MED (GAUZE/BANDAGES/DRESSINGS) ×1 IMPLANT
ELECT CAUTERY BLADE 6.4 (BLADE) ×2 IMPLANT
ELECT REM PT RETURN 9FT ADLT (ELECTROSURGICAL) ×2
ELECTRODE REM PT RTRN 9FT ADLT (ELECTROSURGICAL) ×1 IMPLANT
GLOVE BIOGEL M STRL SZ7.5 (GLOVE) ×2 IMPLANT
GLOVE BIOGEL PI IND STRL 7.0 (GLOVE) IMPLANT
GLOVE BIOGEL PI IND STRL 8 (GLOVE) ×2 IMPLANT
GLOVE BIOGEL PI INDICATOR 7.0 (GLOVE) ×1
GLOVE BIOGEL PI INDICATOR 8 (GLOVE) ×2
GLOVE SS BIOGEL STRL SZ 6.5 (GLOVE) IMPLANT
GLOVE SS BIOGEL STRL SZ 7 (GLOVE) IMPLANT
GLOVE SUPERSENSE BIOGEL SZ 6.5 (GLOVE) ×1
GLOVE SUPERSENSE BIOGEL SZ 7 (GLOVE) ×1
GLOVE SURG SS PI 7.0 STRL IVOR (GLOVE) ×1 IMPLANT
GOWN STRL NON-REIN LRG LVL3 (GOWN DISPOSABLE) ×4 IMPLANT
GOWN STRL REIN XL XLG (GOWN DISPOSABLE) ×2 IMPLANT
KIT BASIN OR (CUSTOM PROCEDURE TRAY) ×2 IMPLANT
KIT ROOM TURNOVER OR (KITS) ×2 IMPLANT
MARKER SKIN DUAL TIP RULER LAB (MISCELLANEOUS) ×1 IMPLANT
MESH VENTRALEX ST 8CM LRG (Mesh General) ×1 IMPLANT
NDL HYPO 25GX1X1/2 BEV (NEEDLE) ×1 IMPLANT
NEEDLE HYPO 25GX1X1/2 BEV (NEEDLE) ×2 IMPLANT
NS IRRIG 1000ML POUR BTL (IV SOLUTION) ×2 IMPLANT
PACK GENERAL/GYN (CUSTOM PROCEDURE TRAY) ×2 IMPLANT
PAD ARMBOARD 7.5X6 YLW CONV (MISCELLANEOUS) ×2 IMPLANT
SPONGE GAUZE 4X4 12PLY (GAUZE/BANDAGES/DRESSINGS) ×1 IMPLANT
STAPLER VISISTAT 35W (STAPLE) IMPLANT
STRIP CLOSURE SKIN 1/2X4 (GAUZE/BANDAGES/DRESSINGS) ×1 IMPLANT
SUT MNCRL AB 4-0 PS2 18 (SUTURE) ×2 IMPLANT
SUT PDS AB 0 CT 36 (SUTURE) ×3 IMPLANT
SUT PDS AB 2-0 CT1 27 (SUTURE) ×1 IMPLANT
SUT PROLENE 0 CT 2 (SUTURE) ×2 IMPLANT
SUT VIC AB 3-0 SH 27 (SUTURE) ×6
SUT VIC AB 3-0 SH 27X BRD (SUTURE) ×1 IMPLANT
SYR CONTROL 10ML LL (SYRINGE) ×2 IMPLANT
TOWEL OR 17X24 6PK STRL BLUE (TOWEL DISPOSABLE) ×2 IMPLANT
TOWEL OR 17X26 10 PK STRL BLUE (TOWEL DISPOSABLE) ×2 IMPLANT
TRAY FOLEY CATH 14FRSI W/METER (CATHETERS) IMPLANT
WATER STERILE IRR 1000ML POUR (IV SOLUTION) IMPLANT

## 2013-04-16 NOTE — Interval H&P Note (Signed)
History and Physical Interval Note:  04/16/2013 7:16 AM  Gilbert Perkins  has presented today for surgery, with the diagnosis of ventral hernia  The various methods of treatment have been discussed with the patient and family. After consideration of risks, benefits and other options for treatment, the patient has consented to  Procedure(s): HERNIA REPAIR VENTRAL ADULT WITH MESH (N/A) INSERTION OF MESH (N/A) as a surgical intervention .  The patient's history has been reviewed, patient examined, no change in status, stable for surgery.  I have reviewed the patient's chart and labs.  Questions were answered to the patient's satisfaction.    Mary Sella. Andrey Campanile, MD, FACS General, Bariatric, & Minimally Invasive Surgery [facility], [place]    [facility] M
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
## 2013-04-16 NOTE — Anesthesia Preprocedure Evaluation (Signed)
Anesthesia Evaluation  Patient identified by MRN, date of birth, ID band Patient awake    Reviewed: Allergy & Precautions, H&P , NPO status , Patient's Chart, lab work & pertinent test results  Airway       Dental   Pulmonary asthma , former smoker,          Cardiovascular     Neuro/Psych  Headaches,    GI/Hepatic   Endo/Other    Renal/GU      Musculoskeletal   Abdominal   Peds  Hematology   Anesthesia Other Findings   Reproductive/Obstetrics                           Anesthesia Physical Anesthesia Plan  ASA: II  Anesthesia Plan: General   Post-op Pain Management:    Induction: Intravenous  Airway Management Planned: Oral ETT  Additional Equipment:   Intra-op Plan:   Post-operative Plan: Extubation in OR  Informed Consent: I have reviewed the patients History and Physical, chart, labs and discussed the procedure including the risks, benefits and alternatives for the proposed anesthesia with the patient or authorized representative who has indicated his/her understanding and acceptance.     Plan Discussed with:   Anesthesia Plan Comments:         Anesthesia Quick Evaluation

## 2013-04-16 NOTE — Transfer of Care (Signed)
Immediate Anesthesia Transfer of Care Note  Patient: Gilbert Perkins  Procedure(s) Performed: Procedure(s): HERNIA REPAIR VENTRAL ADULT WITH MESH (N/A) INSERTION OF MESH (N/A)  Patient Location: PACU  Anesthesia Type:General  Level of Consciousness: awake, alert , oriented and patient cooperative  Airway & Oxygen Therapy: Patient Spontanous Breathing and Patient connected to face mask oxygen  Post-op Assessment: Report given to PACU RN, Post -op Vital signs reviewed and stable and Patient moving all extremities  Post vital signs: Reviewed and stable  Complications: No apparent anesthesia complications

## 2013-04-16 NOTE — Preoperative (Signed)
Beta Blockers   Reason not to administer Beta Blockers:Not Applicable 

## 2013-04-16 NOTE — Anesthesia Postprocedure Evaluation (Signed)
Anesthesia Post Note  Patient: Gilbert Perkins  Procedure(s) Performed: Procedure(s) (LRB): HERNIA REPAIR VENTRAL ADULT WITH MESH (N/A) INSERTION OF MESH (N/A)  Anesthesia type: General  Patient location: PACU  Post pain: Pain level controlled and Adequate analgesia  Post assessment: Post-op Vital signs reviewed, Patient's Cardiovascular Status Stable, Respiratory Function Stable, Patent Airway and Pain level controlled  Last Vitals:  Filed Vitals:   04/16/13 0907  BP:   Pulse:   Temp: 36.6 C  Resp:     Post vital signs: Reviewed and stable  Level of consciousness: awake, alert  and oriented  Complications: No apparent anesthesia complications

## 2013-04-16 NOTE — Brief Op Note (Signed)
04/16/2013  9:01 AM  PATIENT:  Gilbert Perkins  42 y.o. male  PRE-OPERATIVE DIAGNOSIS:  ventral hernia  POST-OPERATIVE DIAGNOSIS:  supraumbilical hernia  PROCEDURE:  Procedure(s): HERNIA REPAIR VENTRAL ADULT WITH MESH (N/A) INSERTION OF MESH (N/A)  SURGEON:  Surgeon(s) and Role:    * Atilano Ina, MD - Primary  PHYSICIAN ASSISTANT:   ASSISTANTS: none   ANESTHESIA:   general  Type of repair - mesh underlay with fascial closure  (choices - primary suture, mesh, or component)  Name of mesh - Bard Ventralight ST  Size of mesh - 8cm round  Mesh overlap - 4 cm  Placement of mesh - beneath fascia and into peritoneal cavity  EBL:  Total I/O In: 1700 [I.V.:1700] Out: 25 [Blood:25]  BLOOD ADMINISTERED:none  DRAINS: none   LOCAL MEDICATIONS USED:  OTHER 30cc exparel  SPECIMEN:  No Specimen  DISPOSITION OF SPECIMEN:  PATHOLOGY  COUNTS:  YES  TOURNIQUET:  * No tourniquets in log *  DICTATION: .Other Dictation: Dictation Number [id number]  PLAN OF CARE: Discharge to home after PACU  PATIENT DISPOSITION:  PACU - hemodynamically stable.   Delay start of Pharmacological VTE agent (>24hrs) due to surgical blood loss or risk of bleeding: not applicable  Mary Sella. Andrey Campanile, MD, FACS General, Bariatric, & Minimally Invasive Surgery [facility], [place]

## 2013-04-17 NOTE — Discharge Planning (Signed)
Copy of discharge instructions to pt who verbalizes understanding.  Also given RX for pain.  Discharged per w/c to private car home with all personal belongings, acomp. By family.

## 2013-04-17 NOTE — Op Note (Signed)
Gilbert Perkins, Gilbert Perkins NO.:  [id number]  MEDICAL RECORD NO.:  [id number]  LOCATION:  6N02C                        FACILITY:  [facility]  PHYSICIAN:  Mary Sella. Andrey Campanile, MD, FACSDATE OF BIRTH:  [birth year]  DATE OF PROCEDURE:  04/16/2013 DATE OF DISCHARGE:                              OPERATIVE REPORT   PREOPERATIVE DIAGNOSIS:  Ventral hernia.  POSTOPERATIVE DIAGNOSIS:  Supraumbilical hernia.  PROCEDURE:  Open repair of supraumbilical hernia with mesh.  SURGEON:  Mary Sella. Andrey Campanile, MD, FACS  ANESTHESIA:  General plus 30 mL of Exparel.  ESTIMATED BLOOD LOSS:  Minimal.  FINDINGS:  The patient had a 2-cm defect about 1 inch above his umbilicus.  It was repaired using an 8-cm round piece of Bard Ventralight ST mesh placed intra-abdominally up against the fascia with the fascia closed transversely over the mesh.  INDICATIONS FOR PROCEDURE:  The patient is a very pleasant 42 year old gentleman who recently lost 50 pounds through diet and exercise, then noticed a lump in his upper abdomen.  It had been interfering with his workout by bulging out and causing discomfort.  He desires surgical repair.  We discussed the pros and cons of laparoscopic versus open procedure.  We discussed the risks and benefits of open repair including but not limited to bleeding, infection, injury to surrounding structures, mesh complications, hernia recurrence, seroma formation, hematoma formation, blood clot formation, anesthesia concerns, chronic abdominal pain, as well as typical recovery course.  He elected to proceed to surgery.  DESCRIPTION OF PROCEDURE:  After obtaining informed consent, the patient was taken to the operating room to the [facility].  He was placed supine on the operating table.  General endotracheal anesthesia was established.  His abdomen was prepped and draped in the usual standard surgical fashion with ChloraPrep.  He received IV antibiotics prior to  skin incision.  A surgical time-out was performed.  I palpated his upper midline.  There appeared to be midline fascial defects slightly above the level of the umbilicus.  I elected to make an upper midline incision extending it around the umbilicus because I felt there could be a more superior epigastric hernia as well.  The skin was incised with a 15 blade.  The subcutaneous tissue was divided with electrocautery.  I encountered the hernia which was about an inch above the umbilicus.  I was able to get down to the fascia all the way around it.  The hernia was just preperitoneal fat.  I was able to reduce it. However, within the preperitoneal space, there was not a good plane of a finger sweep circumferentially around.  I ended up in order to accommodate the mesh.  I felt the best location would be intra- abdominal.  The peritoneum was opened and the abdominal cavity was entered.  I was then able to run my finger circumferentially around and there was nothing attached to the abdominal wall.  There was no upper midline defect.  There was no lower midline extending at least for about 8 cm.  I ended up detaching the umbilicus from the fascia.  I obtained a Bard round piece of Ventralight ST mesh 8 cm, placed it  intra- abdominally and pulled it up against the abdominal wall so it was flushed.  I used 4-0 Prolene sutures to anchor the mesh to the abdominal wall through the fascia.  I confirmed that the mesh was flushed and there was nothing between the mesh and the abdominal wall.  The mesh had been anchored in 4 corners.  The tails of the mesh were then trimmed at the fascia level.  I then closed the fascia transversely with 5 interrupted 0 PDS sutures.  A 30 mL of Exparel was injected in the subcutaneous tissue and along the fascia.  I then irrigated the wound and achieved hemostasis.  The base of the umbilicus was then tacked back to the fascia with 2 interrupted 3-0 Vicryl sutures.  The  subcutaneous tissue was reapproximated with inverted interrupted 3-0 Vicryl sutures. The skin was reapproximated with a running 4-0 Monocryl in a subcuticular fashion.  Benzoin, Steri-Strips, 4 x 4, and a Tegaderm were applied.  The patient was extubated and taken to the recovery room in stable condition.  All needle, instrument, sponge counts were correct x2.  There were no immediate complications.  The patient tolerated the procedure well.     Mary Sella. Andrey Campanile, MD, FACS     EMW/MEDQ  D:  04/16/2013  T:  04/17/2013  Job:  [id number]

## 2013-04-17 NOTE — Care Management Utilization Note (Signed)
UR complete    Sydney Azure,MSN,RN [id number] 

## 2013-04-17 NOTE — Progress Notes (Signed)
1 Day Post-Op   Assessment: s/p Procedure(s): HERNIA REPAIR VENTRAL ADULT WITH MESH INSERTION OF MESH Patient Active Problem List   Diagnosis Date Noted  . Ventral hernia 04/16/2013  . Bunion of left foot 08/17/2012  . Snoring 05/01/2011  . ASTHMA 06/26/2009  . HYPERLIPIDEMIA 07/01/2008  . FASTING HYPERGLYCEMIA 07/01/2008  . NERVE INJURY 07/01/2008    Doping well no problems  Plan: Discharge  Subjective: Feels good and able to go home, no nausea, pain well controlled  Objective: Vital signs in last 24 hours: Temp:  [97.6 F (36.4 C)-98.6 F (37 C)] 97.6 F (36.4 C) (12/13 0556) Pulse Rate:  [76-105] 76 (12/13 0556) Resp:  [9-20] 20 (12/13 0556) BP: (100-116)/(43-75) 114/69 mmHg (12/13 0556) SpO2:  [97 %-100 %] 99 % (12/13 0556) Weight:  [186 lb (84.369 kg)] 186 lb (84.369 kg) (12/12 1135)   Intake/Output from previous day: 12/12 0701 - 12/13 0700 In: 3614.2 [P.O.:1320; I.V.:2294.2] Out: 850 [Urine:825; Blood:25]  General appearance: alert, cooperative and no distress Resp: clear to auscultation bilaterally Cardio: regular rate and rhythm, S1, S2 normal, no murmur, click, rub or gallop GI: soft, non-tender; bowel sounds normal; no masses,  no organomegaly  Incision: DDI  Lab Results:  No results found for this basename: WBC, HGB, HCT, PLT,  in the last 72 hours BMET No results found for this basename: NA, K, CL, CO2, GLUCOSE, BUN, CREATININE, CALCIUM,  in the last 72 hours  MEDS, Scheduled . enoxaparin (LOVENOX) injection  40 mg Subcutaneous Q24H  . ketorolac  30 mg Intravenous Q6H  . pantoprazole  40 mg Oral Daily    Studies/Results: No results found.    LOS: 1 day     Currie Paris, MD, [facility], [place] [phone]   04/17/2013 9:12 AM

## 2013-04-19 ENCOUNTER — Telehealth (INDEPENDENT_AMBULATORY_CARE_PROVIDER_SITE_OTHER): Payer: Self-pay | Admitting: General Surgery

## 2013-04-19 NOTE — Telephone Encounter (Signed)
Message copied by Liliana Cline on Mon Apr 19, 2013  9:54 AM ------      Message from: Andrey Campanile, ERIC M      Created: Mon Apr 19, 2013  7:13 AM       i need to see him in 3-4 weeks            Thanks      wilson      ----- Message -----         From: Currie Paris, MD         Sent: 04/17/2013   9:14 AM           To: Atilano Ina, MD, Liliana Cline, CMA            Discharged doing well on Saturday. Wasn't clear when he needs to come back. Told him Lesly Rubenstein will call Monday with appointment confirmation             ------

## 2013-04-19 NOTE — Telephone Encounter (Signed)
Appt made with patient on 05/13/2012 at 2:45pm.

## 2013-04-19 NOTE — Discharge Summary (Signed)
Physician Discharge Summary  Gilbert Perkins:[id number] DOB: 02-02-1971 DOA: 04/16/2013  PCP: Gilbert Melnick, MD  Admit date: 04/16/2013 Discharge date: 04/17/13  Recommendations for Outpatient Follow-up:   Follow-up Information   Follow up with Gilbert Ina, MD. Schedule an appointment as soon as possible for a visit in 3 weeks.   Specialty:  General Surgery   Contact information:   [address] [place] [address] [phone]      Discharge Diagnoses:  1. Supraumbilical ventral hernia  Surgical Procedure: open repair of supraumbilical ventral hernia with mesh  Discharge Condition: good Disposition: home   Diet recommendation: regular  Filed Weights   04/16/13 1135  Weight: 186 lb (84.369 kg)    Hospital Course:  42 yo WM admitted for overnight observation after undergoing open supraumbilical ventral hernia repair. Postoperatively  He did well. On pod 1 he was discharged home. He was ambulating and tolerating a diet. His pain was controlled. His vitals were stable   Discharge Instructions  Discharge Orders   Future Appointments Provider Department Dept Phone   05/13/2013 2:45 PM Gilbert Ina, MD [facility], [place] [phone]   Future Orders Complete By Expires   Diet - low sodium heart healthy  As directed    Discharge instructions  As directed    Comments:     [facility] _______[facility]  UMBILICAL OR INGUINAL HERNIA REPAIR: POST OP INSTRUCTIONS  Always review your discharge instruction sheet given to you by the facility where your surgery was performed. IF YOU HAVE DISABILITY OR FAMILY LEAVE FORMS, YOU MUST BRING THEM TO THE OFFICE FOR PROCESSING.   DO NOT GIVE THEM TO YOUR DOCTOR.  A  prescription for pain medication may be given to you upon discharge.  Take your pain medication as prescribed, if needed.  If narcotic pain medicine is not needed, then you may take acetaminophen (Tylenol) or ibuprofen (Advil) as  needed. Take your usually prescribed medications unless otherwise directed. If you need a refill on your pain medication, please contact your pharmacy.  They will contact our office to request authorization. Prescriptions will not be filled after 5 pm or on week-ends. You should follow a light diet the first 24 hours after arrival home, such as soup and crackers, etc.  Be sure to include lots of fluids daily.  Resume your normal diet the day after surgery. Most patients will experience some swelling and bruising around the umbilicus or in the groin and scrotum.  Ice packs and reclining will help.  Swelling and bruising can take several days to resolve.  It is common to experience some constipation if taking pain medication after surgery.  Increasing fluid intake and taking a stool softener (such as Colace) will usually help or prevent this problem from occurring.  A mild laxative (Milk of Magnesia or Miralax) should be taken according to package directions if there are no bowel movements after 48 hours. Unless discharge instructions indicate otherwise, you may remove your bandages 24-48 hours after surgery, and you may shower at that time.  You may have steri-strips (small skin tapes) in place directly over the incision.  These strips should be left on the skin for 7-10 days.  If your surgeon used skin glue on the incision, you may shower in 24 hours.  The glue will flake off over the next 2-3 weeks.  Any sutures or staples will be removed at the office during your follow-up visit. ACTIVITIES:  You may resume  regular (light) daily activities beginning the next day-such as daily self-care, walking, climbing stairs-gradually increasing activities as tolerated.  You may have sexual intercourse when it is comfortable.  Refrain from any heavy lifting or straining until approved by your doctor. You may drive when you are no longer taking prescription pain medication, you can comfortably wear a seatbelt, and you  can safely maneuver your car and apply brakes. RETURN TO WORK:  __________________________________________________________ [name] should see your doctor in the office for a follow-up appointment approximately 2-3 weeks after your surgery.  Make sure that you call for this appointment within a day or two after you arrive home to insure a convenient appointment time. OTHER INSTRUCTIONS:  __________________________________________________________________________________________________________________________________________________________________________________________  WHEN TO CALL YOUR DOCTOR: Fever over 101.0 Inability to urinate Nausea and/or vomiting Extreme swelling or bruising Continued bleeding from incision. Increased pain, redness, or drainage from the incision  The clinic staff is available to answer your questions during regular business hours.  Please don't hesitate to call and ask to speak to one of the nurses for clinical concerns.  If you have a medical emergency, go to the nearest emergency room or call 911.  A surgeon from [facility] is always on call at the hospital   [address], [place], [place]  [address] ?  [address] [address], [place], [place]   [address] [phone] ? [phone] ? FAX [phone] Web site: [email or web address]   Increase activity slowly  As directed    May shower / Bathe  As directed    Comments:     Starting tomorrow       Medication List         albuterol 108 (90 BASE) MCG/ACT inhaler  Commonly known as:  PROVENTIL HFA;VENTOLIN HFA  Inhale 1 puff into the lungs every 6 (six) hours as needed for wheezing or shortness of breath.     ALPRAZolam 0.5 MG tablet  Commonly known as:  XANAX  Take 0.5 mg by mouth daily as needed for anxiety.     ibuprofen 600 MG tablet  Commonly known as:  ADVIL,MOTRIN  Take 1 tablet (600 mg total) by mouth every 6 (six) hours as needed for pain.     ONE-A-DAY MENS PO   Take 1 tablet by mouth daily.     oxyCODONE-acetaminophen 7.5-325 MG per tablet  Commonly known as:  PERCOCET  Take 1-2 tablets by mouth every 4 (four) hours as needed for pain.     valACYclovir 1000 MG tablet  Commonly known as:  VALTREX  1 tab by mouth daily           Follow-up Information   Follow up with Gilbert Ina, MD. Schedule an appointment as soon as possible for a visit in 3 weeks.   Specialty:  General Surgery   Contact information:   [address] [place] [address] [phone]        The results of significant diagnostics from this hospitalization (including imaging, microbiology, ancillary and laboratory) are listed below for reference.    Significant Diagnostic Studies: No results found.  Microbiology: No results found for this or any previous visit (from the past 240 hour(s)).   Labs: Basic Metabolic Panel: No results found for this basename: NA, K, CL, CO2, GLUCOSE, BUN, CREATININE, CALCIUM, MG, PHOS,  in the last 168 hours Liver Function Tests: No results found for this basename: AST, ALT, ALKPHOS, BILITOT, PROT, ALBUMIN,  in the last 168 hours No  results found for this basename: LIPASE, AMYLASE,  in the last 168 hours No results found for this basename: AMMONIA,  in the last 168 hours CBC: No results found for this basename: WBC, NEUTROABS, HGB, HCT, MCV, PLT,  in the last 168 hours Cardiac Enzymes: No results found for this basename: CKTOTAL, CKMB, CKMBINDEX, TROPONINI,  in the last 168 hours BNP: BNP (last 3 results) No results found for this basename: PROBNP,  in the last 8760 hours CBG: No results found for this basename: GLUCAP,  in the last 168 hours  Principal Problem:   Ventral hernia   Time coordinating discharge: 10 minutes  Signed:  Atilano Ina, MD [facility] Surgery, [place] [phone] 04/19/2013, 3:44 PM

## 2013-04-20 ENCOUNTER — Encounter (HOSPITAL_COMMUNITY): Payer: Self-pay | Admitting: General Surgery

## 2013-04-22 ENCOUNTER — Telehealth (INDEPENDENT_AMBULATORY_CARE_PROVIDER_SITE_OTHER): Payer: Self-pay | Admitting: *Deleted

## 2013-04-22 NOTE — Telephone Encounter (Signed)
LMOM letting pt know that his RX is ready for pickup from out office.

## 2013-04-22 NOTE — Telephone Encounter (Signed)
Patient called asking for a refill of pain medication.  Patient denies any fevers, redness or signs/sypmtoms of infection.  Patient denies difficulty with BMs or urination.  Patient states he needs something to help with the pain but thinks it could be something less strong.  Explained that per protocol we can do Norco 5/325mg  Take 1 tablet every 6 hours as needed for pain #30 no refills.  Prescription written and taken to Dr. Andrey Campanile to sign, awaiting signature at this time.

## 2013-05-04 ENCOUNTER — Telehealth (INDEPENDENT_AMBULATORY_CARE_PROVIDER_SITE_OTHER): Payer: Self-pay

## 2013-05-04 NOTE — Telephone Encounter (Signed)
Pt is s/p ventral hernia repair with mesh on 04/16/13 by Dr. Andrey Campanile.  He is calling requesting return to work note for 05/10/13.  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  His post op appointment is 05/13/13, but he says he has to return to work on the 5th.  May we draft a RTW note for him?

## 2013-05-07 ENCOUNTER — Encounter (INDEPENDENT_AMBULATORY_CARE_PROVIDER_SITE_OTHER): Payer: Self-pay | Admitting: General Surgery

## 2013-05-07 NOTE — Telephone Encounter (Signed)
Patient returned the call and states he will pick up his note here Monday morning.  He asked about his bandage because he still has the Tegaderm and gauze bandage on and it is starting to come off.  I advised her can remove those two and keep the strips on under it.  The strips will dry up and fall off on their own.

## 2013-05-07 NOTE — Telephone Encounter (Signed)
LMOM asking pt to return my call so I can find out if he would like his RTW note faxed somewhere, mailed, or up at the front desk for him to pick up.

## 2013-05-07 NOTE — Telephone Encounter (Signed)
He can go back to work on 05/10/13 with restrictions. No lifting, pushing, or pulling anything greater than 15 pounds until Jan 24

## 2013-05-13 ENCOUNTER — Encounter (INDEPENDENT_AMBULATORY_CARE_PROVIDER_SITE_OTHER): Payer: Self-pay | Admitting: General Surgery

## 2013-05-13 ENCOUNTER — Ambulatory Visit (INDEPENDENT_AMBULATORY_CARE_PROVIDER_SITE_OTHER): Payer: 59 | Admitting: General Surgery

## 2013-05-13 VITALS — BP 118/76 | HR 60 | Temp 98.5°F | Resp 16 | Ht 71.0 in | Wt 195.0 lb

## 2013-05-13 DIAGNOSIS — Z09 Encounter for follow-up examination after completed treatment for conditions other than malignant neoplasm: Secondary | ICD-10-CM

## 2013-05-13 NOTE — Progress Notes (Signed)
Subjective:     Patient ID: CHA GOMILLION, male   DOB: 02-25-1971, 43 y.o.   MRN: [id number]  HPI 43 year old Caucasian male comes in for followup after undergoing open repair of the supraumbilical hernia with mesh on December 12. He states that he is doing really well. He has returned to work with light duty. He denies any fever, chills, nausea, vomiting, diarrhea or constipation. He denies any continued abdominal discomfort. He is no longer taking pain medication  Review of Systems     Objective:   Physical Exam BP 118/76  Pulse 60  Temp(Src) 98.5 F (36.9 C) (Temporal)  Resp 16  Ht 5\' 11"  (1.803 m)  Wt 195 lb (88.451 kg)  BMI 27.21 kg/m2 Alert, no apparent distress Abdomen-soft, nontender, nondistended. Well-healed upper midline incision. No cellulitis, induration or fluctuance. No hematoma. No evidence of incisional hernia    Assessment:     Status post open repair of supraumbilical hernia with mesh     Plan:     Overall I think he is doing really well. I have released him to do very light cardiovascular activity. He was given a note to return to work at full duty on January 26. 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 Since he is doing so well followup as needed.  Leighton Ruff. Redmond Pulling, MD, FACS General, Bariatric, & Minimally Invasive Surgery [facility], [place]

## 2013-05-13 NOTE — Patient Instructions (Signed)
Can do light cardio now and return to full activities on Jan 26

## 2013-07-07 ENCOUNTER — Other Ambulatory Visit: Payer: Self-pay | Admitting: *Deleted

## 2013-07-07 NOTE — Telephone Encounter (Signed)
Rx was filled x 1 and faxed to the pharmacy on (07-05-13).//AB/CMA

## 2013-07-20 ENCOUNTER — Encounter: Payer: Self-pay | Admitting: Internal Medicine

## 2013-07-20 ENCOUNTER — Ambulatory Visit (INDEPENDENT_AMBULATORY_CARE_PROVIDER_SITE_OTHER): Payer: 59 | Admitting: Internal Medicine

## 2013-07-20 VITALS — BP 108/70 | HR 98 | Temp 99.0°F | Resp 14 | Wt 200.0 lb

## 2013-07-20 DIAGNOSIS — Z23 Encounter for immunization: Secondary | ICD-10-CM

## 2013-07-20 DIAGNOSIS — J019 Acute sinusitis, unspecified: Secondary | ICD-10-CM

## 2013-07-20 MED ORDER — AMOXICILLIN 500 MG PO CAPS: 500.0000 mg | ORAL_CAPSULE | Freq: Three times a day (TID) | ORAL | Status: DC

## 2013-07-20 NOTE — Progress Notes (Signed)
   Subjective:    Patient ID: Gilbert Perkins, male    DOB: 06-11-1970, 43 y.o.   MRN: [id number]  HPI  He had a cold 3 weeks ago and had improved until 07/18/13. He states the symptoms came back "with a vengeance" on that day with marked head congestion associated with itchy, watery eyes.  Mucinex and NyQuil taken with only partial response  At this time he describes frontal headache and maxillary sinus area pain. He's had brownish nasal discharge in the context of marked nasal congestion  He has bilateral otic pain without discharge  He continues to have itchy, watery eyes, and sneezing  He's also had some muscle and joint pain.   Review of Systems  He does not have dental pain, sore throat, shortness of breath, wheezing, fever, chills, or sweats.         Objective:   Physical Exam General appearance:good health ;well nourished; no acute distress or increased work of breathing is present.  No  lymphadenopathy about the head, neck, or axilla noted.   Eyes: No conjunctival inflammation or lid edema is present. Ears:  External ear exam shows no significant lesions or deformities.  Otoscopic examination reveals wax on R; slight R TM scarring Nose:  External nasal examination shows no deformity or inflammation. Nasal mucosa are pink and moist without lesions or exudates. Septal dislocation to L.Marked R> L obstruction to airflow.   Oral exam: Dental hygiene is good; lips and gums are healthy appearing.There is no oropharyngeal erythema or exudate noted.   Neck:  No deformities, masses, or tenderness noted.   Supple with full range of motion without pain.   Heart:  Normal rate and regular rhythm. S1 and S2 normal without gallop, murmur, click, rub or other extra sounds.   Lungs:Chest clear to auscultation; no wheezes, rhonchi,rales ,or rubs present.No increased work of breathing.    Extremities:  No cyanosis, edema, or clubbing  noted    Skin: Warm & dry          Assessment  & Plan:  #1 rhinosinusitis without significant bronchitis  Plan: Nasal hygiene interventions discussed. See prescription medications

## 2013-07-20 NOTE — Progress Notes (Signed)
Pre visit review using our clinic review tool, if applicable. No additional management support is needed unless otherwise documented below in the visit note. 

## 2013-07-20 NOTE — Patient Instructions (Signed)

## 2013-08-24 ENCOUNTER — Other Ambulatory Visit: Payer: Self-pay | Admitting: Internal Medicine

## 2013-08-30 ENCOUNTER — Other Ambulatory Visit: Payer: Self-pay

## 2013-08-30 MED ORDER — ALPRAZOLAM 0.5 MG PO TABS: 0.5000 mg | ORAL_TABLET | Freq: Every day | ORAL | Status: DC | PRN

## 2013-08-30 NOTE — Telephone Encounter (Signed)
This med refill request has been faxed back to the pharmacy

## 2013-11-17 ENCOUNTER — Other Ambulatory Visit: Payer: Self-pay

## 2013-11-17 MED ORDER — ALPRAZOLAM 0.5 MG PO TABS: 0.5000 mg | ORAL_TABLET | Freq: Every day | ORAL | Status: DC | PRN

## 2013-11-17 NOTE — Telephone Encounter (Signed)
OK X1 

## 2013-12-29 ENCOUNTER — Other Ambulatory Visit: Payer: Self-pay

## 2013-12-29 MED ORDER — ALPRAZOLAM 0.5 MG PO TABS: 0.5000 mg | ORAL_TABLET | Freq: Every day | ORAL | Status: DC | PRN

## 2013-12-29 NOTE — Telephone Encounter (Signed)
Last office visit 07/20/13 Med last filled 11/17/13

## 2013-12-29 NOTE — Telephone Encounter (Signed)
OK X1 

## 2014-02-18 ENCOUNTER — Ambulatory Visit (INDEPENDENT_AMBULATORY_CARE_PROVIDER_SITE_OTHER): Payer: 59 | Admitting: Internal Medicine

## 2014-02-18 ENCOUNTER — Other Ambulatory Visit: Payer: Self-pay

## 2014-02-18 ENCOUNTER — Encounter: Payer: Self-pay | Admitting: Internal Medicine

## 2014-02-18 ENCOUNTER — Other Ambulatory Visit (INDEPENDENT_AMBULATORY_CARE_PROVIDER_SITE_OTHER): Payer: 59

## 2014-02-18 VITALS — BP 116/80 | HR 84 | Temp 98.4°F | Resp 12 | Wt 222.0 lb

## 2014-02-18 DIAGNOSIS — N529 Male erectile dysfunction, unspecified: Secondary | ICD-10-CM | POA: Insufficient documentation

## 2014-02-18 LAB — TSH: TSH: 1.08 u[IU]/mL (ref 0.35–4.50)

## 2014-02-18 LAB — HEMOGLOBIN A1C: Hgb A1c MFr Bld: 5.5 % (ref 4.6–6.5)

## 2014-02-18 MED ORDER — TADALAFIL 20 MG PO TABS: 10.0000 mg | ORAL_TABLET | ORAL | Status: DC | PRN

## 2014-02-18 NOTE — Progress Notes (Signed)
   Subjective:    Patient ID: Gilbert Perkins, male    DOB: [birth year], 43 y.o.   MRN: [id number]  HPI   He describes erectile dysfunction for the last year as difficulty getting and keeping an erection. He has not tried any medication for this. He specifically denies significant muscle weakness or decreased libido.  He is an ex-smoker. He drinks occasionally only.  He is physically active walking 3 times a week 2-6 miles at a time  He does have a prescription for alprazolam which he takes on average a half every third day.  At this time is asthma is quiescent. He has no symptoms of diabetes or thyroid dysfunction.    Review of Systems  Pertinent negative or absent signs and symptoms are as follows: Constitutional: No significant change in weight; significant fatigue; sleep disorder; change in appetite. Eye: no blurred, double vision ,loss of vision Cardiovascular: no palpitations; racing; irregularity ENT/GI: no constipation; diarrhea;hoarseness;dysphagia Derm: no change in nails,hair,skin Neuro: no numbness or tingling; tremor Psych:no anxiety; depression; panic attacks Endo: no temperature intolerance to heat ,cold.No polyuria,polydipsia,polyphagia       Objective:   Physical Exam  Gen.: Healthy and well-nourished in appearance. Alert, appropriate and cooperative throughout exam. Head: Normocephalic without obvious abnormalities; head shaven  Eyes: No corneal or conjunctival inflammation noted. Pupils equal round reactive to light and accommodation.  Ears: External  ear exam reveals no significant lesions or deformities.  Nose: External nasal exam reveals no deformity or inflammation. Nasal mucosa are pink and moist. No lesions or exudates noted.  Neck: No deformities, masses, or tenderness noted. Range of motion & Thyroid normal Lungs: Normal respiratory effort; chest expands symmetrically. Lungs are clear to auscultation without rales, wheezes, or increased work of  breathing. Heart: Normal rate and rhythm. Normal S1 and S2. No gallop, click, or rub. No murmur. Abdomen: Bowel sounds normal; abdomen soft and nontender. No masses, organomegaly or hernias noted. Genitalia: Genitalia normal except for left varices. Musculoskeletal/extremities: No deformity or scoliosis noted of  the thoracic or lumbar spine.  No clubbing, cyanosis, edema, or significant extremity  deformity noted. Range of motion normal .Tone & strength normal. Hand joints normal  Fingernail / toenail health good. Vascular: Carotid, radial artery, dorsalis pedis and  posterior tibial pulses are full and equal. No bruits present. Neurologic: Alert and oriented x3. Deep tendon reflexes symmetrical and normal.  Gait normal        Skin: Intact without suspicious lesions or rashes. Lymph: No cervical, axillary, or inguinal lymphadenopathy present. Psych: Mood and affect are normal. Normally interactive                                                                                       Assessment & Plan:  #1 ED  Pathophysiology discussed. Signs & symptoms of Testosterone reviewed. See orders

## 2014-02-18 NOTE — Patient Instructions (Signed)
Your next office appointment will be determined based upon review of your pending labs . Those instructions will be transmitted to you through My Chart .  Followup as needed for your acute issue. Please report any significant change in your symptoms. 

## 2014-02-18 NOTE — Assessment & Plan Note (Signed)
A1c, TSH Trial of Cialis

## 2014-02-18 NOTE — Progress Notes (Signed)
Pre visit review using our clinic review tool, if applicable. No additional management support is needed unless otherwise documented below in the visit note. 

## 2014-03-07 ENCOUNTER — Other Ambulatory Visit: Payer: Self-pay | Admitting: Internal Medicine

## 2014-03-07 NOTE — Telephone Encounter (Signed)
Alprazolam has been called to [facility]

## 2014-03-07 NOTE — Telephone Encounter (Signed)
10.16.15 last office visit  8.26.15 #30 med last filled

## 2014-03-07 NOTE — Telephone Encounter (Signed)
OK X1 

## 2014-03-21 ENCOUNTER — Other Ambulatory Visit: Payer: Self-pay | Admitting: Internal Medicine

## 2014-04-20 ENCOUNTER — Other Ambulatory Visit: Payer: Self-pay | Admitting: Internal Medicine

## 2014-04-20 NOTE — Telephone Encounter (Signed)
OK X1 

## 2014-04-21 NOTE — Telephone Encounter (Signed)
Alprazolam has been called to [facility] on [address] 

## 2014-05-25 ENCOUNTER — Other Ambulatory Visit: Payer: Self-pay

## 2014-05-25 MED ORDER — ALPRAZOLAM 0.5 MG PO TABS: ORAL_TABLET | ORAL | Status: DC

## 2014-05-25 NOTE — Telephone Encounter (Signed)
OK X1 

## 2014-05-25 NOTE — Telephone Encounter (Signed)
Alprazolam has been called to [facility] on [address]

## 2014-06-20 ENCOUNTER — Other Ambulatory Visit: Payer: Self-pay | Admitting: Internal Medicine

## 2014-06-20 NOTE — Telephone Encounter (Signed)
Alprazolam has been called to [facility] on [address] and [address]

## 2014-06-20 NOTE — Telephone Encounter (Signed)
OK X1 

## 2014-07-23 ENCOUNTER — Other Ambulatory Visit: Payer: Self-pay | Admitting: Internal Medicine

## 2014-07-25 NOTE — Telephone Encounter (Signed)
OK X1 

## 2014-07-25 NOTE — Telephone Encounter (Signed)
ALPRAZOLAM HAS BEEN CALLED TO [facility] ON [address]

## 2014-08-23 ENCOUNTER — Other Ambulatory Visit: Payer: Self-pay | Admitting: Internal Medicine

## 2014-08-23 NOTE — Telephone Encounter (Signed)
OK X1 

## 2014-08-23 NOTE — Telephone Encounter (Signed)
Alprazolam has been called to [facility] on [address]

## 2014-09-23 ENCOUNTER — Other Ambulatory Visit: Payer: Self-pay | Admitting: Internal Medicine

## 2014-09-23 NOTE — Telephone Encounter (Signed)
Please advise, thanks.

## 2014-09-23 NOTE — Telephone Encounter (Signed)
OK X1 

## 2014-09-26 ENCOUNTER — Other Ambulatory Visit: Payer: Self-pay | Admitting: Internal Medicine

## 2014-09-26 ENCOUNTER — Other Ambulatory Visit: Payer: Self-pay

## 2014-09-26 MED ORDER — VALACYCLOVIR HCL 1 G PO TABS: 1000.0000 mg | ORAL_TABLET | Freq: Every day | ORAL | Status: DC

## 2014-09-26 NOTE — Telephone Encounter (Signed)
OK # 90 

## 2014-09-26 NOTE — Telephone Encounter (Signed)
Patients last office visit was 02/2014---are you ok with filling rx---please advise, thanks

## 2014-09-26 NOTE — Telephone Encounter (Signed)
rx for valtrex sent

## 2014-10-28 ENCOUNTER — Other Ambulatory Visit: Payer: Self-pay | Admitting: Internal Medicine

## 2014-10-28 NOTE — Telephone Encounter (Signed)
OK X1  My retirement date is 05/06/2015; but I will be in office only T, Weds & Thurs during the months Oct-Dec.You should transition your care to another PCP by Oct 1,2016.   

## 2014-10-31 ENCOUNTER — Other Ambulatory Visit: Payer: Self-pay | Admitting: Emergency Medicine

## 2014-10-31 MED ORDER — ALPRAZOLAM 0.5 MG PO TABS: ORAL_TABLET | ORAL | Status: DC

## 2014-10-31 NOTE — Telephone Encounter (Signed)
Verify ultrasound is listed this as sen verify the prescription isthankst;

## 2014-12-03 ENCOUNTER — Other Ambulatory Visit: Payer: Self-pay | Admitting: Internal Medicine

## 2014-12-05 ENCOUNTER — Other Ambulatory Visit: Payer: Self-pay

## 2014-12-05 MED ORDER — ALPRAZOLAM 0.5 MG PO TABS: ORAL_TABLET | ORAL | Status: DC

## 2014-12-05 NOTE — Telephone Encounter (Signed)
Please advise, thanks.

## 2014-12-05 NOTE — Telephone Encounter (Signed)
Xanax rx faxed to [facility] on [address]

## 2014-12-05 NOTE — Telephone Encounter (Signed)
OK X1  Needs OV for refills

## 2015-03-09 ENCOUNTER — Other Ambulatory Visit: Payer: Self-pay | Admitting: Internal Medicine

## 2015-03-13 ENCOUNTER — Other Ambulatory Visit: Payer: Self-pay | Admitting: Internal Medicine

## 2015-03-13 NOTE — Telephone Encounter (Signed)
Please advise on xanax, thanks

## 2015-03-14 NOTE — Telephone Encounter (Signed)
He needs OV ; last seen 10/15 Getting ready to go into lectures; I will not be able to get on EMR today. Thanks, [employer]

## 2015-03-16 ENCOUNTER — Ambulatory Visit: Payer: Self-pay | Admitting: Internal Medicine

## 2015-03-16 DIAGNOSIS — R4689 Other symptoms and signs involving appearance and behavior: Secondary | ICD-10-CM | POA: Insufficient documentation

## 2015-03-16 DIAGNOSIS — Z0289 Encounter for other administrative examinations: Secondary | ICD-10-CM

## 2015-03-17 ENCOUNTER — Ambulatory Visit (INDEPENDENT_AMBULATORY_CARE_PROVIDER_SITE_OTHER): Payer: 59 | Admitting: Internal Medicine

## 2015-03-17 VITALS — HR 92 | Wt 229.2 lb

## 2015-03-17 DIAGNOSIS — J4520 Mild intermittent asthma, uncomplicated: Secondary | ICD-10-CM | POA: Diagnosis not present

## 2015-03-17 DIAGNOSIS — E785 Hyperlipidemia, unspecified: Secondary | ICD-10-CM | POA: Diagnosis not present

## 2015-03-17 DIAGNOSIS — N529 Male erectile dysfunction, unspecified: Secondary | ICD-10-CM

## 2015-03-17 MED ORDER — ALPRAZOLAM 0.5 MG PO TABS: ORAL_TABLET | ORAL | Status: DC

## 2015-03-17 MED ORDER — VALACYCLOVIR HCL 1 G PO TABS: 1000.0000 mg | ORAL_TABLET | Freq: Every day | ORAL | Status: DC

## 2015-03-17 MED ORDER — TADALAFIL 20 MG PO TABS: 10.0000 mg | ORAL_TABLET | ORAL | Status: DC | PRN

## 2015-03-17 MED ORDER — ALBUTEROL SULFATE HFA 108 (90 BASE) MCG/ACT IN AERS: 2.0000 | INHALATION_SPRAY | Freq: Four times a day (QID) | RESPIRATORY_TRACT | Status: DC | PRN

## 2015-03-17 NOTE — Patient Instructions (Signed)
Risk of premature heart attack or stroke increases as LDL or BAD cholesterol rises, especially if there is family history of heart attack in males before 63 or women before 47. Based on your advanced testing, your LDL goal is < 100 , ideally < 70 Your last LDL increases long term heart attack or stroke risk 44 %.  Please follow a Mediaterranean type diet  (many good cook books readily available) or review Dr Nunzio Cory book Eat, [place] for best  dietary cholesterol information & options.  Cardiovascular exercise, this can be as simple a program as walking, is recommended 30-45 minutes 3-4 times per week. If you cannot make significant changes in exercise and nutrition; statin medication at low dose would be the best therapeutic option to reduce your long term risk.

## 2015-03-17 NOTE — Progress Notes (Signed)
Pre visit review using our clinic review tool, if applicable. No additional management support is needed unless otherwise documented below in the visit note. 

## 2015-03-17 NOTE — Progress Notes (Signed)
   Subjective:    Patient ID: Gilbert Perkins, male    DOB: 03/23/1971, 44 y.o.   MRN: [id number]  HPI  He has been compliant with his medications without adverse effects. He eats red meat 3-4 times per week and fried foods 2-3 times per week. He also cooks with salt. He exercises four-5 times per week on an elliptical for 45-60 minutes each session. He has not worked out over the last 3 weeks as he is preparing to sell his house.  His last LDL on record was 144; LDL goal has been determined to be less than 100. Long-term risks were discussed with him.  Review of systems is positive for nocturia once nightly. He has had intermittent pain in the DIP and PIP joints of the right index finger over the last 6 months up to level VI. There was no trauma or injury. He also has swelling and pain in the base of the left foot over the last 3 days. He finds Cialis of benefit and requests a refill. He rarely needs to use his rescue inhaler.He does use it pre exercise for EIB. He does take 1/2 of an Alprazolam qhs to help sleep. He has a history of snoring but not apnea.  He is too young for colonoscopy; he has no active GI symptoms.  Review of Systems  Chest pain, palpitations, tachycardia, exertional dyspnea, paroxysmal nocturnal dyspnea, claudication or edema are absent. No unexplained weight loss, abdominal pain, significant dyspepsia, dysphagia, melena, rectal bleeding, or persistently small caliber stools. Dysuria, pyuria, hematuria, frequency, or polyuria are denied. Change in hair, skin, nails denied. No bowel changes of constipation or diarrhea. No intolerance to heat or cold.     Objective:   Physical Exam  Pertinent or positive findings include: Head is shaven. He has a Engineering geologist. There is dislocation of the nasal septum. He has slight crepitus of the knees. There is some subcutaneous prominence of the base of the left foot. Pes planus is present.  General appearance :adequately nourished; in  no distress.  Eyes: No conjunctival inflammation or scleral icterus is present.  Oral exam:  Lips and gums are healthy appearing.There is no oropharyngeal erythema or exudate noted. Dental hygiene is good.  Heart:  Normal rate and regular rhythm. S1 and S2 normal without gallop, murmur, click, rub or other extra sounds    Lungs:Chest clear to auscultation; no wheezes, rhonchi,rales ,or rubs present.No increased work of breathing.   Abdomen: bowel sounds normal, soft and non-tender without masses, organomegaly or hernias noted.  No guarding or rebound.   Vascular : all pulses equal ; no bruits present.  Skin:Warm & dry.  Intact without suspicious lesions or rashes ; no tenting or jaundice   Lymphatic: No lymphadenopathy is noted about the head, neck, axilla.   Neuro: Strength, tone & DTRs normal.     Assessment & Plan:

## 2015-05-18 ENCOUNTER — Other Ambulatory Visit: Payer: Self-pay | Admitting: Internal Medicine

## 2015-05-19 ENCOUNTER — Other Ambulatory Visit: Payer: Self-pay | Admitting: Internal Medicine

## 2015-05-19 ENCOUNTER — Telehealth: Payer: Self-pay | Admitting: Emergency Medicine

## 2015-05-19 MED ORDER — ALPRAZOLAM 0.5 MG PO TABS: 0.5000 mg | ORAL_TABLET | Freq: Every day | ORAL | Status: DC

## 2015-05-19 NOTE — Telephone Encounter (Signed)
Patient has scheduled march 6th appt to establish care with you---are you ok with either a 30 day or 45 day supply of xanax until he sees you--please advise, thanks

## 2015-05-19 NOTE — Telephone Encounter (Signed)
Xanax faxed to [facility] on [address].

## 2015-07-10 ENCOUNTER — Ambulatory Visit: Payer: Self-pay | Admitting: Internal Medicine

## 2015-07-26 ENCOUNTER — Encounter: Payer: Self-pay | Admitting: Family

## 2015-07-26 ENCOUNTER — Ambulatory Visit (INDEPENDENT_AMBULATORY_CARE_PROVIDER_SITE_OTHER): Payer: 59 | Admitting: Family

## 2015-07-26 VITALS — BP 128/82 | HR 97 | Temp 98.2°F | Resp 16 | Ht 71.0 in | Wt 240.0 lb

## 2015-07-26 DIAGNOSIS — N509 Disorder of male genital organs, unspecified: Secondary | ICD-10-CM | POA: Diagnosis not present

## 2015-07-26 DIAGNOSIS — N5089 Other specified disorders of the male genital organs: Secondary | ICD-10-CM

## 2015-07-26 NOTE — Progress Notes (Signed)
Pre visit review using our clinic review tool, if applicable. No additional management support is needed unless otherwise documented below in the visit note. 

## 2015-07-26 NOTE — Assessment & Plan Note (Signed)
Testicular mass noted on the right testicle that is non-tender. Obtain ultrasound with differentials including spermatocele, hydrocele, or testicular cancer. Follow up and additional treatment pending ultrasound results.
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
## 2015-07-26 NOTE — Patient Instructions (Signed)
Thank you for choosing [facility].  Summary/Instructions:  If your symptoms worsen or fail to improve, please contact our office for further instruction, or in case of emergency go directly to the emergency room at the closest medical facility.   Scrotal Masses Scrotal swelling is common in men of all ages. Common types of testicular masses include:   Hydrocele. The most common benign testicular mass in an adult. Hydroceles are generally soft and painless collections of fluid in the scrotal sac. These can rapidly change size as the fluid enters or leaves. Hydroceles can be associated with an underlying cancer of the testicle.  Spermatoceles. Generally soft and painless cyst-like masses in the scrotum that contain fluid, usually above the testicle. They can rapidly change size as the fluid enters or leaves. They are more prominent while standing or exercising. Sometimes, spermatoceles may cause a sensation of heaviness or a dull ache.  Orchitis. Inflammation of the testicle. It is painful and may be associated with a fever or symptoms of a urinary tract infection, including frequent and painful urination. It is common in males who have the mumps.  Varicocele. An enlargement of the veins that drain the testicles. Varicoceles usually occur on the left side of the scrotum. This condition can increase the risk of infertility. Varicocele is sometimes more prominent while standing or exercising. Sometimes, varicoceles may cause a sensation of heaviness or a dull ache.  Inguinal hernia. A bulge caused by a portion of intestine protruding into the scrotum through a weak area in the abdominal muscles. Hernias may or may not be painful. They are soft and usually enlarge with coughing or straining.  Torsion of the testis. This can cause a testicular mass that develops quickly and is associated with tenderness or fever, or both. It is caused by a twisting of the testicle within the sac. It also reduces  the blood supply and can destroy the testis if not treated quickly with surgery.  Epididymitis. Inflammation of the epididymis (a structure attached above and behind the testicle), usually caused by a urinary tract infection or a sexually transmitted infection. This generally shows up as testicular discomfort and swelling and may include pain during urination. It is frequently associated with a testicle infection.  Testicular appendages. Remnants of tissue on the testis present since birth. A testicular appendage can twist on its blood supply and cause pain. In most cases, this is seen as a blue dot on the scrotum.  Hematocele. A collection of blood between the layers of the sac inside the scrotum. It usually is caused by trauma to the scrotum.  Sebaceous cysts. These can be a swelling in the skin of the scrotum and are usually painless.  Cancer (carcinoma) of the skin of the scrotum. It can cause scrotal swelling, but this is rare.   This information is not intended to replace advice given to you by your health care provider. Make sure you discuss any questions you have with your health care provider.   Document Released: 10/27/2002 Document Revised: 12/23/2012 Document Reviewed: 10/12/2012 Elsevier Interactive Patient Education [employer].

## 2015-08-03 ENCOUNTER — Encounter: Payer: Self-pay | Admitting: Family

## 2015-08-03 ENCOUNTER — Ambulatory Visit
Admission: RE | Admit: 2015-08-03 | Discharge: 2015-08-03 | Disposition: A | Discharge status: Home / Self Care | Payer: 59 | Source: Ambulatory Visit | Attending: Family | Admitting: Family

## 2015-08-03 DIAGNOSIS — N5089 Other specified disorders of the male genital organs: Secondary | ICD-10-CM

## 2015-08-04 ENCOUNTER — Telehealth: Payer: Self-pay | Admitting: Internal Medicine

## 2015-08-04 NOTE — Telephone Encounter (Signed)
Patient states that he has a no show fee from 11/10.  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  He is requesting a refund on the fee.

## 2015-08-07 NOTE — Telephone Encounter (Signed)
No previous No Show's, advised billing to void NS Fee for [facility] 03/16/15. Pt aware.

## 2015-09-15 ENCOUNTER — Ambulatory Visit (INDEPENDENT_AMBULATORY_CARE_PROVIDER_SITE_OTHER): Payer: 59 | Admitting: Internal Medicine

## 2015-09-15 ENCOUNTER — Encounter: Payer: Self-pay | Admitting: Internal Medicine

## 2015-09-15 VITALS — BP 116/82 | HR 79 | Temp 98.2°F | Resp 16 | Ht 71.0 in | Wt 235.0 lb

## 2015-09-15 DIAGNOSIS — N529 Male erectile dysfunction, unspecified: Secondary | ICD-10-CM | POA: Diagnosis not present

## 2015-09-15 DIAGNOSIS — E785 Hyperlipidemia, unspecified: Secondary | ICD-10-CM

## 2015-09-15 DIAGNOSIS — J4520 Mild intermittent asthma, uncomplicated: Secondary | ICD-10-CM | POA: Diagnosis not present

## 2015-09-15 DIAGNOSIS — A6000 Herpesviral infection of urogenital system, unspecified: Secondary | ICD-10-CM

## 2015-09-15 DIAGNOSIS — F419 Anxiety disorder, unspecified: Secondary | ICD-10-CM | POA: Diagnosis not present

## 2015-09-15 DIAGNOSIS — R7309 Other abnormal glucose: Secondary | ICD-10-CM

## 2015-09-15 MED ORDER — VALACYCLOVIR HCL 1 G PO TABS: 1000.0000 mg | ORAL_TABLET | Freq: Every day | ORAL | Status: DC

## 2015-09-15 MED ORDER — ALPRAZOLAM 0.5 MG PO TABS: 0.5000 mg | ORAL_TABLET | Freq: Every day | ORAL | Status: DC

## 2015-09-15 MED ORDER — ALBUTEROL SULFATE HFA 108 (90 BASE) MCG/ACT IN AERS: 2.0000 | INHALATION_SPRAY | Freq: Four times a day (QID) | RESPIRATORY_TRACT | Status: DC | PRN

## 2015-09-15 MED ORDER — TADALAFIL 20 MG PO TABS: 10.0000 mg | ORAL_TABLET | ORAL | Status: DC | PRN

## 2015-09-15 NOTE — Assessment & Plan Note (Signed)
Check lipids Stressed improving diet, weight loss and exercise

## 2015-09-15 NOTE — Patient Instructions (Signed)
  Test(s) ordered today. Your results will be released to [place] (or called to you) after review, usually within 72hours after test completion. If any changes need to be made, you will be notified at that same time.  All other Health Maintenance issues reviewed.   All recommended immunizations and age-appropriate screenings are up-to-date or discussed.  No immunizations administered today.   Medications reviewed and updated.  No changes recommended at this time.  Your prescription(s) have been submitted to your pharmacy. Please take as directed and contact our office if you believe you are having problem(s) with the medication(s).   Please followup in one year, sooner if needed

## 2015-09-15 NOTE — Assessment & Plan Note (Addendum)
Exercise induced only Uses albuterol as needed continue same
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
## 2015-09-15 NOTE — Progress Notes (Signed)
Pre visit review using our clinic review tool, if applicable. No additional management support is needed unless otherwise documented below in the visit note. 

## 2015-09-15 NOTE — Assessment & Plan Note (Signed)
Continue xanax as needed

## 2015-09-15 NOTE — Assessment & Plan Note (Signed)
Check a1c Work on diet, exercise, weight loss

## 2015-09-16 NOTE — Assessment & Plan Note (Signed)
Cialis as needed - refilled today

## 2015-09-16 NOTE — Assessment & Plan Note (Signed)
Controlled with valtrex daily Refilled today

## 2015-11-30 ENCOUNTER — Other Ambulatory Visit: Payer: Self-pay | Admitting: Internal Medicine

## 2015-12-01 NOTE — Telephone Encounter (Signed)
RX faxed to [facility] 

## 2016-04-07 ENCOUNTER — Other Ambulatory Visit: Payer: Self-pay | Admitting: Internal Medicine

## 2016-04-07 DIAGNOSIS — N529 Male erectile dysfunction, unspecified: Secondary | ICD-10-CM

## 2016-05-08 ENCOUNTER — Other Ambulatory Visit: Payer: Self-pay | Admitting: Internal Medicine

## 2016-05-08 DIAGNOSIS — N529 Male erectile dysfunction, unspecified: Secondary | ICD-10-CM
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
## 2016-07-08 ENCOUNTER — Encounter: Payer: Self-pay | Admitting: Internal Medicine

## 2016-07-08 ENCOUNTER — Other Ambulatory Visit: Payer: Self-pay | Admitting: Internal Medicine

## 2016-07-08 ENCOUNTER — Ambulatory Visit (INDEPENDENT_AMBULATORY_CARE_PROVIDER_SITE_OTHER): Payer: 59 | Admitting: Internal Medicine

## 2016-07-08 VITALS — BP 118/82 | HR 91 | Temp 98.5°F | Resp 16 | Ht 71.0 in | Wt 244.0 lb

## 2016-07-08 DIAGNOSIS — A6000 Herpesviral infection of urogenital system, unspecified: Secondary | ICD-10-CM | POA: Diagnosis not present

## 2016-07-08 DIAGNOSIS — J4520 Mild intermittent asthma, uncomplicated: Secondary | ICD-10-CM

## 2016-07-08 DIAGNOSIS — N529 Male erectile dysfunction, unspecified: Secondary | ICD-10-CM

## 2016-07-08 DIAGNOSIS — T148XXA Other injury of unspecified body region, initial encounter: Secondary | ICD-10-CM | POA: Diagnosis not present

## 2016-07-08 DIAGNOSIS — M79672 Pain in left foot: Secondary | ICD-10-CM

## 2016-07-08 DIAGNOSIS — F419 Anxiety disorder, unspecified: Secondary | ICD-10-CM | POA: Diagnosis not present

## 2016-07-08 DIAGNOSIS — M79671 Pain in right foot: Secondary | ICD-10-CM | POA: Diagnosis not present

## 2016-07-08 DIAGNOSIS — E7800 Pure hypercholesterolemia, unspecified: Secondary | ICD-10-CM | POA: Diagnosis not present

## 2016-07-08 DIAGNOSIS — R7309 Other abnormal glucose: Secondary | ICD-10-CM

## 2016-07-08 MED ORDER — VENTOLIN HFA 108 (90 BASE) MCG/ACT IN AERS: 1.0000 | INHALATION_SPRAY | Freq: Four times a day (QID) | RESPIRATORY_TRACT | 5 refills | Status: DC | PRN

## 2016-07-08 MED ORDER — ALPRAZOLAM 0.5 MG PO TABS: 0.5000 mg | ORAL_TABLET | Freq: Every day | ORAL | 1 refills | Status: DC

## 2016-07-08 MED ORDER — SILDENAFIL CITRATE 20 MG PO TABS: ORAL_TABLET | ORAL | 5 refills | Status: DC

## 2016-07-08 MED ORDER — VALACYCLOVIR HCL 1 G PO TABS: 1000.0000 mg | ORAL_TABLET | Freq: Every day | ORAL | 1 refills | Status: DC

## 2016-07-08 MED ORDER — DULOXETINE HCL 30 MG PO CPEP: 30.0000 mg | ORAL_CAPSULE | Freq: Every day | ORAL | 5 refills | Status: DC

## 2016-07-08 NOTE — Assessment & Plan Note (Signed)
Trial of sildenafil A couple of samples of levitra given

## 2016-07-08 NOTE — Assessment & Plan Note (Signed)
Check lipid panel  Regular exercise and healthy diet encouraged  

## 2016-07-08 NOTE — Assessment & Plan Note (Signed)
Balls of feet - ? OA No shoe is comfortable Will refer to podiatry
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
## 2016-07-08 NOTE — Assessment & Plan Note (Signed)
He takes 1/2  xanax a couple of times a week.   It works well Will continue

## 2016-07-08 NOTE — Patient Instructions (Addendum)
  Test(s) ordered today. Your results will be released to [facility] (or called to you) after review, usually within 72hours after test completion. If any changes need to be made, you will be notified at that same time.  All other Health Maintenance issues reviewed.   All recommended immunizations and age-appropriate screenings are up-to-date or discussed.  No immunizations administered today.   Medications reviewed and updated.  Changes include trying sildenafil instead of cialis.    Your prescription(s) have been submitted to your pharmacy. Please take as directed and contact our office if you believe you are having problem(s) with the medication(s).   Please followup in one year sooner

## 2016-07-08 NOTE — Assessment & Plan Note (Signed)
Check a1c 

## 2016-07-08 NOTE — Assessment & Plan Note (Signed)
Continue valtrex  

## 2016-07-08 NOTE — Assessment & Plan Note (Signed)
Mild, intermittent Albuterol as needed 

## 2016-07-08 NOTE — Progress Notes (Signed)
Pre visit review using our clinic review tool, if applicable. No additional management support is needed unless otherwise documented below in the visit note. 

## 2016-07-10 ENCOUNTER — Telehealth: Payer: Self-pay | Admitting: *Deleted

## 2016-07-10 ENCOUNTER — Encounter: Payer: Self-pay | Admitting: Internal Medicine

## 2016-07-10 DIAGNOSIS — A6000 Herpesviral infection of urogenital system, unspecified: Secondary | ICD-10-CM

## 2016-07-10 MED ORDER — ACYCLOVIR 400 MG PO TABS: 400.0000 mg | ORAL_TABLET | Freq: Two times a day (BID) | ORAL | 3 refills | Status: DC

## 2016-07-10 NOTE — Telephone Encounter (Signed)
Acyclovir sent to [facility]

## 2016-07-10 NOTE — Telephone Encounter (Signed)
Rec'd fax stating pt insurance plan will not cover Valacyclovir but will cover Acyclovir tab. Pls advise on what mg does not come in 1000 mg...Gilbert Perkins

## 2016-07-11 MED ORDER — SILDENAFIL CITRATE 20 MG PO TABS: ORAL_TABLET | ORAL | 5 refills | Status: DC

## 2016-07-18 ENCOUNTER — Telehealth: Payer: Self-pay | Admitting: Emergency Medicine

## 2016-07-18 MED ORDER — VALACYCLOVIR HCL 1 G PO TABS: 1000.0000 mg | ORAL_TABLET | Freq: Two times a day (BID) | ORAL | 5 refills | Status: DC

## 2016-07-22 NOTE — Telephone Encounter (Signed)
RX sent to [facility] 

## 2016-08-12 DIAGNOSIS — M6702 Short Achilles tendon (acquired), left ankle: Secondary | ICD-10-CM | POA: Diagnosis not present

## 2016-08-12 DIAGNOSIS — G5762 Lesion of plantar nerve, left lower limb: Secondary | ICD-10-CM | POA: Diagnosis not present

## 2016-09-04 ENCOUNTER — Encounter: Payer: Self-pay | Admitting: Neurology

## 2016-09-04 ENCOUNTER — Ambulatory Visit (INDEPENDENT_AMBULATORY_CARE_PROVIDER_SITE_OTHER): Payer: 59 | Admitting: Neurology

## 2016-09-04 ENCOUNTER — Encounter: Payer: Self-pay | Admitting: Internal Medicine

## 2016-09-04 VITALS — BP 130/88 | HR 97 | Ht 71.0 in | Wt 241.5 lb

## 2016-09-04 DIAGNOSIS — IMO0001 Reserved for inherently not codable concepts without codable children: Secondary | ICD-10-CM

## 2016-09-04 DIAGNOSIS — R209 Unspecified disturbances of skin sensation: Secondary | ICD-10-CM

## 2016-09-04 DIAGNOSIS — M792 Neuralgia and neuritis, unspecified: Secondary | ICD-10-CM

## 2016-09-04 DIAGNOSIS — D3613 Benign neoplasm of peripheral nerves and autonomic nervous system of lower limb, including hip: Secondary | ICD-10-CM

## 2016-09-04 DIAGNOSIS — N529 Male erectile dysfunction, unspecified: Secondary | ICD-10-CM

## 2016-09-04 MED ORDER — NORTRIPTYLINE HCL 10 MG PO CAPS: ORAL_CAPSULE | ORAL | 3 refills | Status: DC
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
## 2016-09-04 NOTE — Patient Instructions (Addendum)
1.  Start nortriptyline 10mg  at bedtime for 2 week, then increase to 2 tablet at bedtime 2.  NCS/EMG of the legs 3.  Follow-up with [facility] about likely neuroma of the left foot  Return to clinic in 4 months

## 2016-09-05 ENCOUNTER — Other Ambulatory Visit: Payer: Self-pay | Admitting: *Deleted

## 2016-09-05 DIAGNOSIS — M79605 Pain in left leg: Secondary | ICD-10-CM

## 2016-09-05 DIAGNOSIS — M79604 Pain in right leg: Secondary | ICD-10-CM

## 2016-09-05 NOTE — Progress Notes (Unsigned)
emg 

## 2016-09-06 ENCOUNTER — Ambulatory Visit: Payer: Self-pay | Admitting: Neurology

## 2016-09-07 MED ORDER — TADALAFIL 20 MG PO TABS: ORAL_TABLET | ORAL | 8 refills | Status: DC

## 2016-09-19 ENCOUNTER — Ambulatory Visit (INDEPENDENT_AMBULATORY_CARE_PROVIDER_SITE_OTHER): Payer: 59 | Admitting: Neurology

## 2016-09-19 DIAGNOSIS — M79605 Pain in left leg: Secondary | ICD-10-CM

## 2016-09-19 DIAGNOSIS — M79604 Pain in right leg: Secondary | ICD-10-CM | POA: Diagnosis not present

## 2016-09-19 DIAGNOSIS — G5732 Lesion of lateral popliteal nerve, left lower limb: Secondary | ICD-10-CM
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
## 2016-10-02 ENCOUNTER — Telehealth: Payer: Self-pay | Admitting: Emergency Medicine

## 2016-10-02 IMAGING — US US SCROTUM
1 series · 14 of 25 positions shown · non-contrast
Comparison: None.

CLINICAL DATA: Right scrotal/testicular mass.

EXAM:
ULTRASOUND OF SCROTUM
TECHNIQUE: Complete ultrasound examination of the testicles, epididymis, and
other scrotal structures was performed.

[Series 1: us scrotum · 0.08mm/px · 14 of 40 slices shown]
[im 1/40]
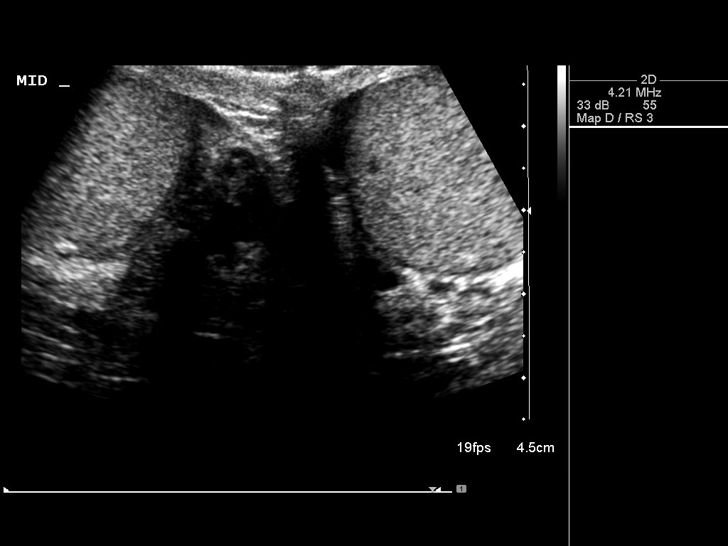
[im 4/40]
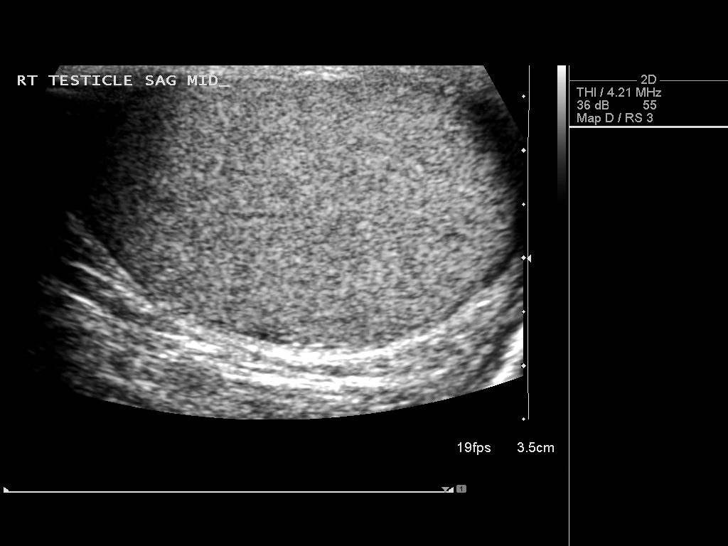
[im 7/40]
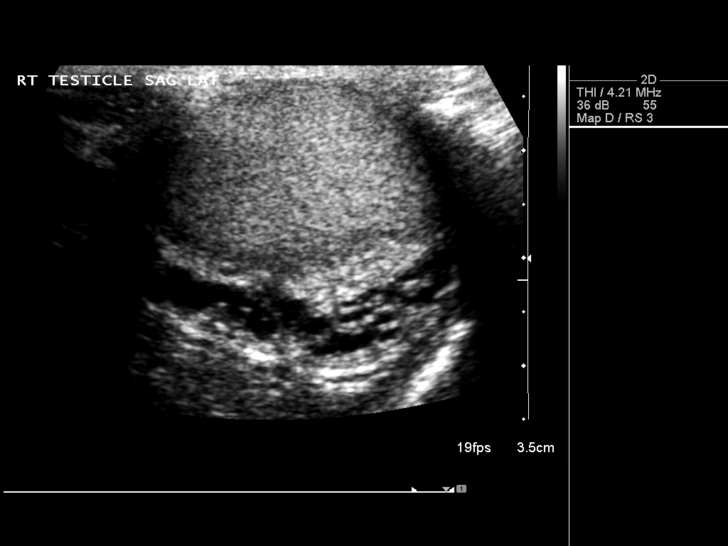
[im 10/40]
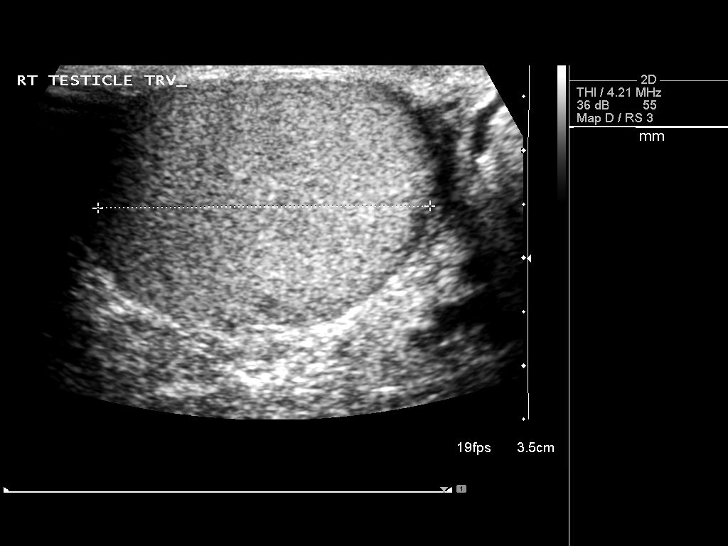
[im 14/40]
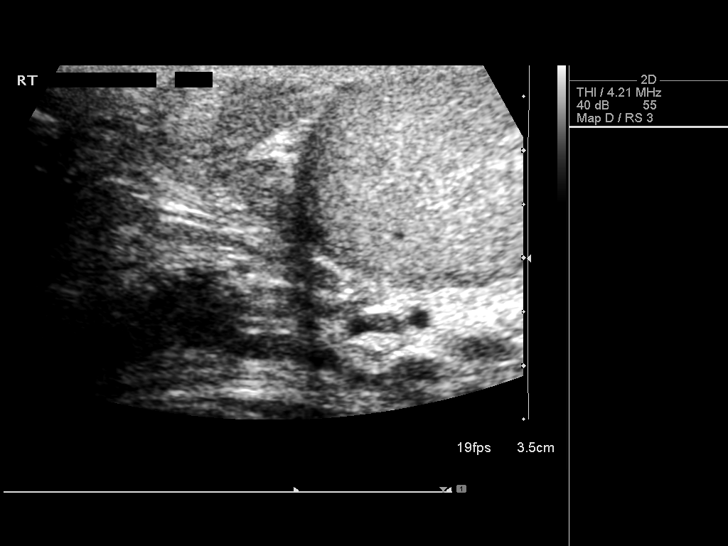
[im 15/40]
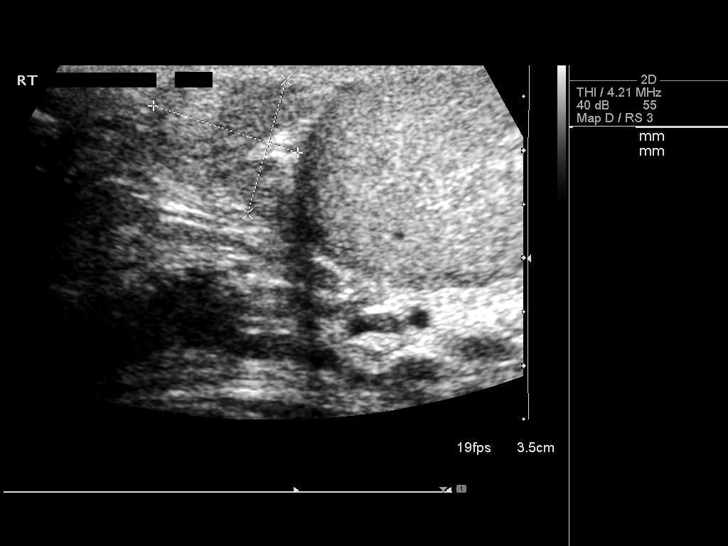
[im 18/40]
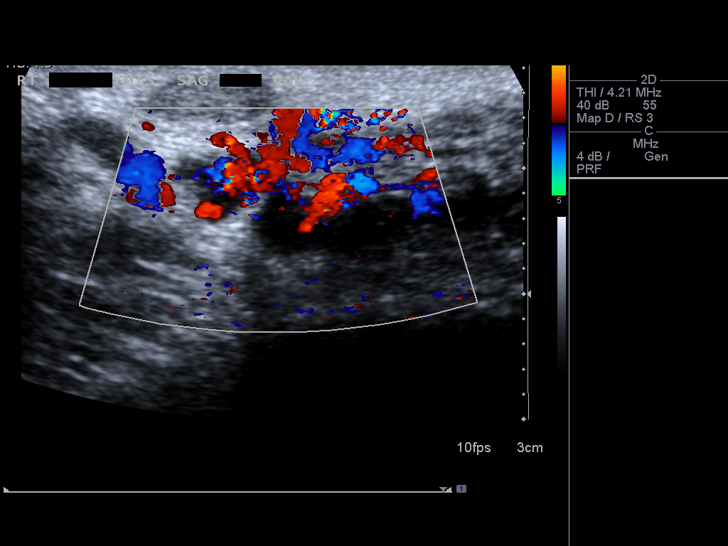
[im 22/40]
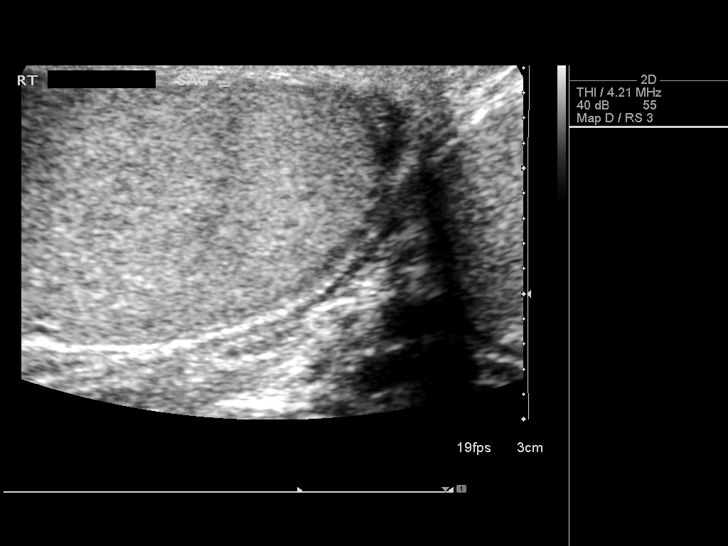
[im 25/40]
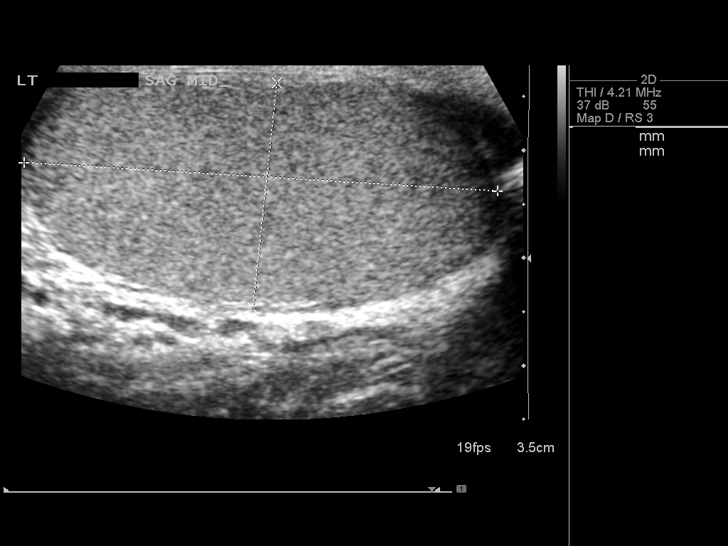
[im 27/40]
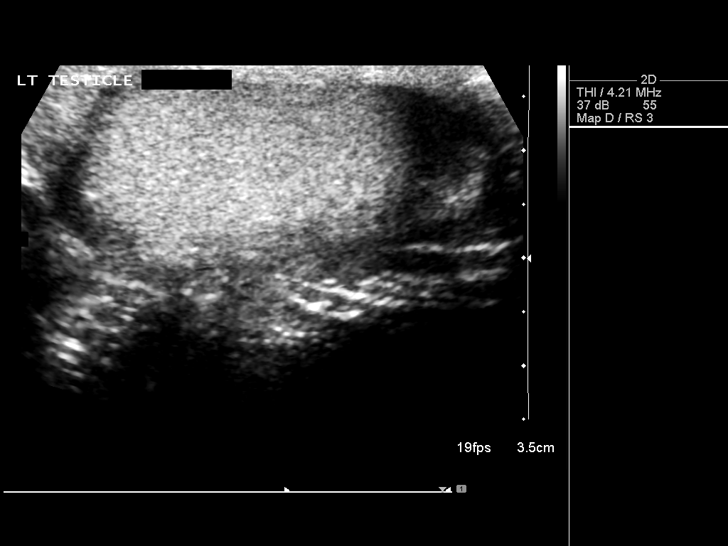
[im 30/40]
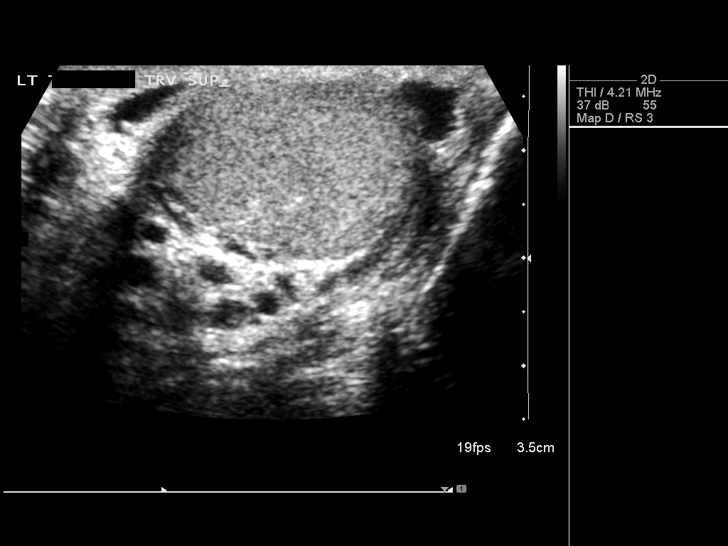
[im 33/40]
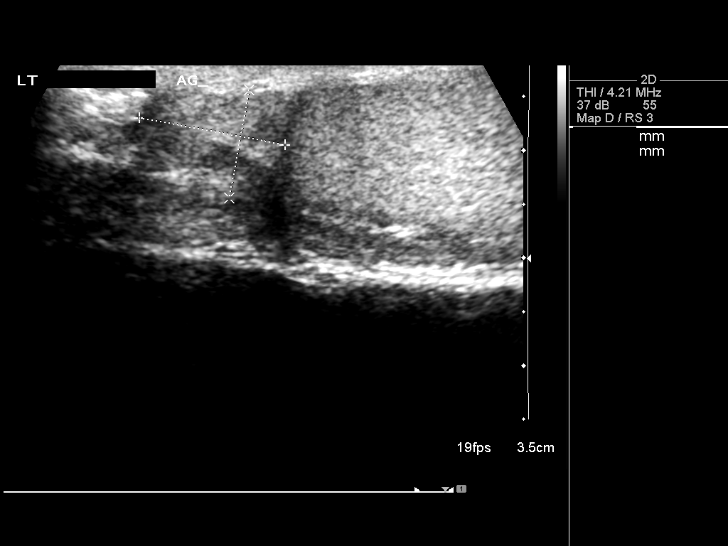
[im 36/40]
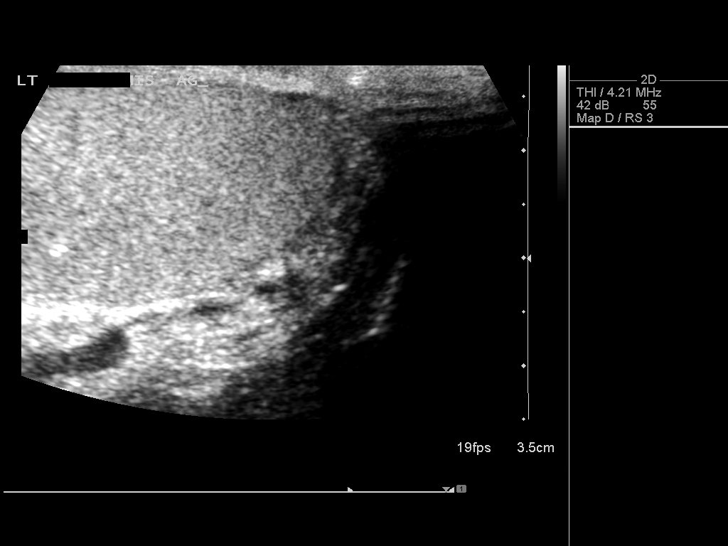
[im 40/40]
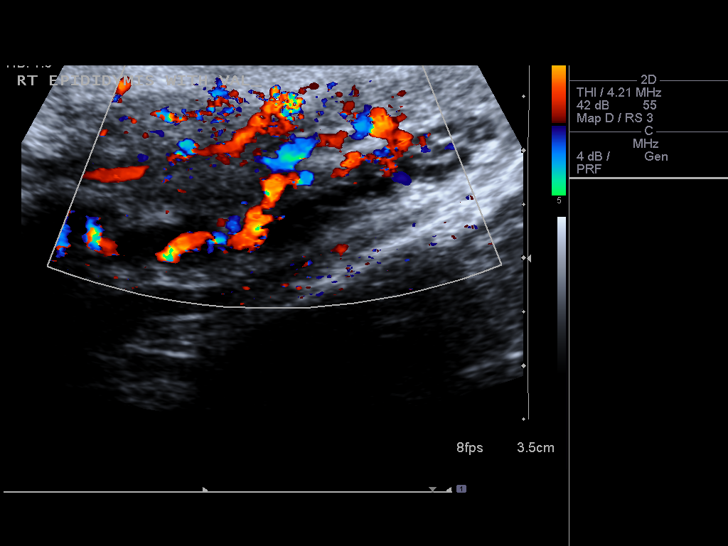

[14 of 25 positions shown; findings below may reference images not displayed]

FINDINGS: Right testicle

Measurements: 4.1 x 2.5 x 3.1 cm. No mass or microlithiasis
visualized.

Left testicle

Measurements: 4.4 x 2.1 x 2.9 cm. No mass or microlithiasis
visualized.

Right epididymis:  Normal in size and appearance.

Left epididymis:  Normal in size and appearance.

Hydrocele:  None visualized.

Varicocele:  Small right-sided varicocele.  No left varicocele.
IMPRESSION: 1. Normal testicles.  No evidence of a mass.
2. Small right-sided varicocele.  No other abnormality.

## 2016-10-02 MED ORDER — ACYCLOVIR 400 MG PO TABS: 400.0000 mg | ORAL_TABLET | Freq: Two times a day (BID) | ORAL | 3 refills | Status: DC

## 2016-10-02 NOTE — Telephone Encounter (Signed)
Received fax from [place], Valtrex is not covered under pts plan, preferred is Acyclovir tab. Please advise if okay to change

## 2016-10-02 NOTE — Telephone Encounter (Signed)
Ok to change - sent to pharmacy

## 2016-10-07 DIAGNOSIS — G5762 Lesion of plantar nerve, left lower limb: Secondary | ICD-10-CM | POA: Diagnosis not present

## 2016-10-07 DIAGNOSIS — M6702 Short Achilles tendon (acquired), left ankle: Secondary | ICD-10-CM | POA: Diagnosis not present

## 2016-10-13 ENCOUNTER — Other Ambulatory Visit: Payer: Self-pay | Admitting: Internal Medicine

## 2016-10-14 NOTE — Telephone Encounter (Signed)
RX faxed to [facility] 

## 2016-10-17 ENCOUNTER — Encounter (HOSPITAL_BASED_OUTPATIENT_CLINIC_OR_DEPARTMENT_OTHER): Payer: Self-pay | Admitting: *Deleted

## 2016-10-24 ENCOUNTER — Ambulatory Visit (HOSPITAL_BASED_OUTPATIENT_CLINIC_OR_DEPARTMENT_OTHER): Payer: 59 | Admitting: Certified Registered"

## 2016-10-24 ENCOUNTER — Encounter (HOSPITAL_BASED_OUTPATIENT_CLINIC_OR_DEPARTMENT_OTHER)
Admission: RE | Disposition: A | Discharge status: Home / Self Care | Payer: Self-pay | Source: Ambulatory Visit | Attending: Orthopedic Surgery

## 2016-10-24 ENCOUNTER — Encounter (HOSPITAL_BASED_OUTPATIENT_CLINIC_OR_DEPARTMENT_OTHER): Payer: Self-pay | Admitting: *Deleted

## 2016-10-24 ENCOUNTER — Ambulatory Visit (HOSPITAL_BASED_OUTPATIENT_CLINIC_OR_DEPARTMENT_OTHER)
Admission: RE | Admit: 2016-10-24 | Discharge: 2016-10-24 | Disposition: A | Discharge status: Home / Self Care | Payer: 59 | Source: Ambulatory Visit | Attending: Orthopedic Surgery | Admitting: Orthopedic Surgery

## 2016-10-24 DIAGNOSIS — J45909 Unspecified asthma, uncomplicated: Secondary | ICD-10-CM | POA: Insufficient documentation

## 2016-10-24 DIAGNOSIS — G5762 Lesion of plantar nerve, left lower limb: Secondary | ICD-10-CM

## 2016-10-24 DIAGNOSIS — M216X2 Other acquired deformities of left foot: Secondary | ICD-10-CM | POA: Diagnosis not present

## 2016-10-24 DIAGNOSIS — G8918 Other acute postprocedural pain: Secondary | ICD-10-CM | POA: Diagnosis not present

## 2016-10-24 DIAGNOSIS — M7742 Metatarsalgia, left foot: Secondary | ICD-10-CM | POA: Diagnosis not present

## 2016-10-24 DIAGNOSIS — Z79899 Other long term (current) drug therapy: Secondary | ICD-10-CM | POA: Insufficient documentation

## 2016-10-24 DIAGNOSIS — B009 Herpesviral infection, unspecified: Secondary | ICD-10-CM | POA: Diagnosis not present

## 2016-10-24 DIAGNOSIS — E785 Hyperlipidemia, unspecified: Secondary | ICD-10-CM | POA: Diagnosis not present

## 2016-10-24 DIAGNOSIS — F419 Anxiety disorder, unspecified: Secondary | ICD-10-CM | POA: Diagnosis not present

## 2016-10-24 HISTORY — PX: METATARSAL OSTEOTOMY: SHX1641

## 2016-10-24 HISTORY — PX: EXCISION MORTON'S NEUROMA: SHX5013

## 2016-10-24 HISTORY — DX: Anxiety disorder, unspecified: F41.9

## 2016-10-24 HISTORY — DX: Sleep apnea, unspecified: G47.30

## 2016-10-24 SURGERY — EXCISION, MORTON'S NEUROMA
Anesthesia: General | Site: Foot | Laterality: Left

## 2016-10-24 MED ORDER — OXYCODONE HCL 5 MG PO TABS: 5.0000 mg | ORAL_TABLET | Freq: Once | ORAL | Status: DC | PRN

## 2016-10-24 MED ORDER — MEPERIDINE HCL 25 MG/ML IJ SOLN: 6.2500 mg | INTRAMUSCULAR | Status: DC | PRN

## 2016-10-24 MED ORDER — FENTANYL CITRATE (PF) 100 MCG/2ML IJ SOLN
INTRAMUSCULAR | Status: AC
  Filled 2016-10-24: qty 2

## 2016-10-24 MED ORDER — OXYCODONE HCL 5 MG PO TABS: 5.0000 mg | ORAL_TABLET | ORAL | 0 refills | Status: DC | PRN

## 2016-10-24 MED ORDER — PROMETHAZINE HCL 25 MG/ML IJ SOLN: 6.2500 mg | INTRAMUSCULAR | Status: DC | PRN

## 2016-10-24 MED ORDER — MIDAZOLAM HCL 2 MG/2ML IJ SOLN
1.0000 mg | INTRAMUSCULAR | Status: DC | PRN
  Administered 2016-10-24: 2 mg via INTRAVENOUS

## 2016-10-24 MED ORDER — SODIUM CHLORIDE 0.9 % IR SOLN
Status: DC | PRN
  Administered 2016-10-24: 1

## 2016-10-24 MED ORDER — HYDROMORPHONE HCL 1 MG/ML IJ SOLN: 0.2500 mg | INTRAMUSCULAR | Status: DC | PRN

## 2016-10-24 MED ORDER — DEXAMETHASONE SODIUM PHOSPHATE 10 MG/ML IJ SOLN
INTRAMUSCULAR | Status: DC | PRN
  Administered 2016-10-24: 10 mg via INTRAVENOUS

## 2016-10-24 MED ORDER — OXYCODONE HCL 5 MG/5ML PO SOLN: 5.0000 mg | Freq: Once | ORAL | Status: DC | PRN

## 2016-10-24 MED ORDER — MIDAZOLAM HCL 2 MG/2ML IJ SOLN
INTRAMUSCULAR | Status: AC
  Filled 2016-10-24: qty 2

## 2016-10-24 MED ORDER — LACTATED RINGERS IV SOLN
INTRAVENOUS | Status: DC
  Administered 2016-10-24 (×2): via INTRAVENOUS

## 2016-10-24 MED ORDER — CHLORHEXIDINE GLUCONATE 4 % EX LIQD: 60.0000 mL | Freq: Once | CUTANEOUS | Status: DC

## 2016-10-24 MED ORDER — LIDOCAINE HCL (CARDIAC) 20 MG/ML IV SOLN
INTRAVENOUS | Status: DC | PRN
  Administered 2016-10-24: 30 mg via INTRAVENOUS

## 2016-10-24 MED ORDER — CEFAZOLIN SODIUM-DEXTROSE 2-4 GM/100ML-% IV SOLN
INTRAVENOUS | Status: AC
Start: 2016-10-24 — End: 2016-10-24
  Filled 2016-10-24: qty 100

## 2016-10-24 MED ORDER — ROPIVACAINE HCL 5 MG/ML IJ SOLN
INTRAMUSCULAR | Status: DC | PRN
  Administered 2016-10-24: 30 mL via PERINEURAL

## 2016-10-24 MED ORDER — FENTANYL CITRATE (PF) 100 MCG/2ML IJ SOLN
50.0000 ug | INTRAMUSCULAR | Status: DC | PRN
  Administered 2016-10-24: 100 ug via INTRAVENOUS

## 2016-10-24 MED ORDER — CEFAZOLIN SODIUM-DEXTROSE 2-4 GM/100ML-% IV SOLN
2.0000 g | INTRAVENOUS | Status: AC
  Administered 2016-10-24: 2 g via INTRAVENOUS

## 2016-10-24 MED ORDER — ONDANSETRON HCL 4 MG/2ML IJ SOLN
INTRAMUSCULAR | Status: DC | PRN
  Administered 2016-10-24: 4 mg via INTRAVENOUS

## 2016-10-24 MED ORDER — SODIUM CHLORIDE 0.9 % IV SOLN: INTRAVENOUS | Status: DC

## 2016-10-24 MED ORDER — SCOPOLAMINE 1 MG/3DAYS TD PT72: 1.0000 | MEDICATED_PATCH | Freq: Once | TRANSDERMAL | Status: DC | PRN

## 2016-10-24 MED ORDER — PROPOFOL 10 MG/ML IV BOLUS
INTRAVENOUS | Status: DC | PRN
  Administered 2016-10-24: 200 mg via INTRAVENOUS

## 2016-10-24 SURGICAL SUPPLY — 74 items
BANDAGE ESMARK 6X9 LF (GAUZE/BANDAGES/DRESSINGS) IMPLANT
BLADE AVERAGE 25X9 (BLADE) IMPLANT
BLADE LONG MED 25X9 (BLADE) ×1 IMPLANT
BLADE OSC/SAG .038X5.5 CUT EDG (BLADE) IMPLANT
BLADE SURG 15 STRL LF DISP TIS (BLADE) ×2 IMPLANT
BLADE SURG 15 STRL SS (BLADE) ×4
BNDG CMPR 9X6 STRL LF SNTH (GAUZE/BANDAGES/DRESSINGS)
BNDG COHESIVE 4X5 TAN STRL (GAUZE/BANDAGES/DRESSINGS) ×2 IMPLANT
BNDG COHESIVE 6X5 TAN STRL LF (GAUZE/BANDAGES/DRESSINGS) IMPLANT
BNDG CONFORM 2 STRL LF (GAUZE/BANDAGES/DRESSINGS) ×2 IMPLANT
BNDG CONFORM 3 STRL LF (GAUZE/BANDAGES/DRESSINGS) ×2 IMPLANT
BNDG ESMARK 6X9 LF (GAUZE/BANDAGES/DRESSINGS)
CHLORAPREP W/TINT 26ML (MISCELLANEOUS) ×2 IMPLANT
CORDS BIPOLAR (ELECTRODE) ×2 IMPLANT
COVER BACK TABLE 60X90IN (DRAPES) ×2 IMPLANT
CUFF TOURNIQUET SINGLE 18IN (TOURNIQUET CUFF) IMPLANT
CUFF TOURNIQUET SINGLE 24IN (TOURNIQUET CUFF) ×1 IMPLANT
CUFF TOURNIQUET SINGLE 34IN LL (TOURNIQUET CUFF) IMPLANT
DRAPE EXTREMITY T 121X128X90 (DRAPE) ×2 IMPLANT
DRAPE OEC MINIVIEW 54X84 (DRAPES) ×2 IMPLANT
DRAPE U-SHAPE 47X51 STRL (DRAPES) ×2 IMPLANT
DRSG MEPITEL 4X7.2 (GAUZE/BANDAGES/DRESSINGS) ×2 IMPLANT
DRSG PAD ABDOMINAL 8X10 ST (GAUZE/BANDAGES/DRESSINGS) ×2 IMPLANT
ELECT REM PT RETURN 9FT ADLT (ELECTROSURGICAL) ×2
ELECTRODE REM PT RTRN 9FT ADLT (ELECTROSURGICAL) ×1 IMPLANT
GAUZE SPONGE 4X4 12PLY STRL (GAUZE/BANDAGES/DRESSINGS) ×2 IMPLANT
GLOVE BIO SURGEON STRL SZ8 (GLOVE) ×2 IMPLANT
GLOVE BIOGEL PI IND STRL 6.5 (GLOVE) IMPLANT
GLOVE BIOGEL PI IND STRL 7.0 (GLOVE) IMPLANT
GLOVE BIOGEL PI IND STRL 8 (GLOVE) ×2 IMPLANT
GLOVE BIOGEL PI INDICATOR 6.5 (GLOVE) ×1
GLOVE BIOGEL PI INDICATOR 7.0 (GLOVE) ×2
GLOVE BIOGEL PI INDICATOR 8 (GLOVE) ×1
GLOVE ECLIPSE 6.5 STRL STRAW (GLOVE) ×1 IMPLANT
GLOVE ECLIPSE 8.0 STRL XLNG CF (GLOVE) ×1 IMPLANT
GLOVE SURG SS PI 6.0 STRL IVOR (GLOVE) ×1 IMPLANT
GOWN STRL REUS W/ TWL LRG LVL3 (GOWN DISPOSABLE) ×1 IMPLANT
GOWN STRL REUS W/ TWL XL LVL3 (GOWN DISPOSABLE) ×2 IMPLANT
GOWN STRL REUS W/TWL LRG LVL3 (GOWN DISPOSABLE) ×4
GOWN STRL REUS W/TWL XL LVL3 (GOWN DISPOSABLE) ×2
K-WIRE DBL END .054 LG (WIRE) IMPLANT
NDL HYPO 25X1 1.5 SAFETY (NEEDLE) IMPLANT
NEEDLE HYPO 22GX1.5 SAFETY (NEEDLE) IMPLANT
NEEDLE HYPO 25X1 1.5 SAFETY (NEEDLE) IMPLANT
NS IRRIG 1000ML POUR BTL (IV SOLUTION) ×2 IMPLANT
PACK BASIN DAY SURGERY FS (CUSTOM PROCEDURE TRAY) ×2 IMPLANT
PAD CAST 4YDX4 CTTN HI CHSV (CAST SUPPLIES) ×1 IMPLANT
PADDING CAST ABS 4INX4YD NS (CAST SUPPLIES)
PADDING CAST ABS COTTON 4X4 ST (CAST SUPPLIES) IMPLANT
PADDING CAST COTTON 4X4 STRL (CAST SUPPLIES) ×2
PENCIL BUTTON HOLSTER BLD 10FT (ELECTRODE) ×2 IMPLANT
SANITIZER HAND PURELL 535ML FO (MISCELLANEOUS) ×2 IMPLANT
SCREW HCS TWIST-OFF 2.0X14MM (Screw) ×2 IMPLANT
SHEET MEDIUM DRAPE 40X70 STRL (DRAPES) ×2 IMPLANT
SLEEVE SCD COMPRESS KNEE MED (MISCELLANEOUS) ×2 IMPLANT
SPONGE LAP 18X18 X RAY DECT (DISPOSABLE) ×1 IMPLANT
SPONGE LAP 4X18 X RAY DECT (DISPOSABLE) ×1 IMPLANT
STOCKINETTE 6  STRL (DRAPES) ×1
STOCKINETTE 6 STRL (DRAPES) ×1 IMPLANT
STRIP CLOSURE SKIN 1/2X4 (GAUZE/BANDAGES/DRESSINGS) IMPLANT
SUCTION FRAZIER HANDLE 10FR (MISCELLANEOUS) ×1
SUCTION TUBE FRAZIER 10FR DISP (MISCELLANEOUS) IMPLANT
SUT ETHILON 3 0 PS 1 (SUTURE) ×2 IMPLANT
SUT MNCRL AB 3-0 PS2 18 (SUTURE) ×2 IMPLANT
SUT VIC AB 0 SH 27 (SUTURE) IMPLANT
SUT VIC AB 2-0 SH 27 (SUTURE)
SUT VIC AB 2-0 SH 27XBRD (SUTURE) IMPLANT
SUT VICRYL 0 UR6 27IN ABS (SUTURE) IMPLANT
SYR BULB 3OZ (MISCELLANEOUS) ×2 IMPLANT
SYR CONTROL 10ML LL (SYRINGE) IMPLANT
TOWEL OR 17X24 6PK STRL BLUE (TOWEL DISPOSABLE) ×2 IMPLANT
TUBE CONNECTING 20X1/4 (TUBING) ×1 IMPLANT
UNDERPAD 30X30 (UNDERPADS AND DIAPERS) ×2 IMPLANT
YANKAUER SUCT BULB TIP NO VENT (SUCTIONS) IMPLANT
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
## 2016-10-24 NOTE — Transfer of Care (Signed)
Immediate Anesthesia Transfer of Care Note  Patient: Gilbert Perkins  Procedure(s) Performed: Procedure(s): EXCISION MORTON'S NEUROMA (Left) METATARSAL OSTEOTOMY (Left)  Patient Location: PACU  Anesthesia Type:GA combined with regional for post-op pain  Level of Consciousness: awake and patient cooperative  Airway & Oxygen Therapy: Patient Spontanous Breathing and Patient connected to face mask oxygen  Post-op Assessment: Report given to RN and Post -op Vital signs reviewed and stable  Post vital signs: Reviewed and stable  Last Vitals:  Vitals:   10/24/16 1205 10/24/16 1210  BP:    Pulse: 76 67  Resp: (!) 24 12  Temp:      Last Pain:  Vitals:   10/24/16 1137  PainSc: 0-No pain         Complications: No apparent anesthesia complications

## 2016-10-24 NOTE — Anesthesia Postprocedure Evaluation (Signed)
Anesthesia Post Note  Patient: Gilbert Perkins  Procedure(s) Performed: Procedure(s) (LRB): EXCISION MORTON'S NEUROMA (Left) METATARSAL OSTEOTOMY (Left)     Patient location during evaluation: PACU Anesthesia Type: General Level of consciousness: awake and alert Pain management: pain level controlled Vital Signs Assessment: post-procedure vital signs reviewed and stable Respiratory status: spontaneous breathing, nonlabored ventilation and respiratory function stable Cardiovascular status: blood pressure returned to baseline and stable Postop Assessment: no signs of nausea or vomiting Anesthetic complications: no    Last Vitals:  Vitals:   10/24/16 1345 10/24/16 1406  BP: 121/85 (!) 141/84  Pulse: 80 91  Resp: 17 18  Temp:  36.6 C    Last Pain:  Vitals:   10/24/16 1345  PainSc: 0-No pain                 Lynda Rainwater

## 2016-10-24 NOTE — Op Note (Signed)
NAMESANTANNA, OLENIK NO.:  [id number]  MEDICAL RECORD NO.:  [id number]  LOCATION:                                 FACILITY:  PHYSICIAN:  Wylene Simmer, MD             DATE OF BIRTH:  DATE OF PROCEDURE:  10/24/2016 DATE OF DISCHARGE:                              OPERATIVE REPORT   PREOPERATIVE DIAGNOSES: 1. Left second and third metatarsalgia. 2. Left second webspace Morton neuroma.  POSTOPERATIVE DIAGNOSES: 1. Left second and third metatarsalgia. 2. Left second webspace Morton neuroma.  PROCEDURE: 1. Excision of left second webspace Morton neuroma. 2. Left second metatarsal Weil osteotomy. 3. Left third metatarsal Weil osteotomy. 4. Left foot AP and lateral radiographs.  SURGEON:  Wylene Simmer, MD  ANESTHESIA:  General, regional.  ESTIMATED BLOOD LOSS:  Minimal.  TOURNIQUET TIME:  26 minutes at 200 mmHg.  COMPLICATIONS:  None apparent.  DISPOSITION:  Extubated, awake, and stable to Recovery.  INDICATIONS FOR PROCEDURE:  The patient is a 46 year old man with a long history of left forefoot pain.  His signs and symptoms are consistent with metatarsalgia as well as a second webspace Morton neuroma.  He has failed nonoperative treatment to date including activity modification, oral anti-inflammatories, shoe modification, and orthotics.  He has also had steroid injections.  He presents now for operative treatment of this painful condition.  He understands the risks and benefits of the alternative treatment options and elects surgical treatment.  He specifically understands risks of bleeding, infection, nerve damage, blood clots, need for additional surgery, continued pain, nonunion, amputation, and death.  PROCEDURE IN DETAIL:  After preoperative consent was obtained and the correct operative site was identified, the patient was brought to the operating room and placed supine on the operating table.  General anesthesia was induced.  Preoperative  antibiotics were administered. Surgical time-out was taken.  The left lower extremity was prepped and draped in standard sterile fashion with a tourniquet around the calf. The extremity was exsanguinated, and the calf tourniquet was inflated to 200 mmHg.  A longitudinal incision was then made over the second webspace.  Dissection was carried down through skin and subcutaneous tissue.  The intermetatarsal ligament was identified.  It was divided under direct vision in its entirety.  The interdigital nerve was then identified.  It was traced proximally to the level of the interossei and transected.  It was then dissected distally past the bifurcation and transected as it branched to the second and third toes.  The nerve was noted to be quite thickened and fibrotic distally.  The wound was then irrigated copiously.  The third metatarsal was then addressed.  An incision was made in the joint capsule, and the extensor tendons were retracted exposing the metatarsal head.  A Weil osteotomy was made with the oscillating saw, removing a small wedge of bone distally.  The head of the metatarsal was allowed to retract proximally a few millimeters and was fixed with a 2- mm Biomet FRS screw.  The second metatarsal was addressed in the same fashion, again shortening the metatarsal head several millimeters and fixing the osteotomy with a 2-mm  Biomet FRS screw.  AP and lateral radiographs confirmed appropriate shortening of the metatarsals and appropriate position and length of both screws.  Wound was irrigated copiously.  Subcutaneous tissues were approximated with Monocryl.  The skin incision was closed with nylon.  Sterile dressings were applied followed by compression wrap.  Tourniquet was released after application of dressings at 26 minutes.  The patient was awakened from anesthesia and transported to the recovery room in stable condition.  FOLLOWUP PLAN:  The patient will be weightbearing as  tolerated on the left foot in a flat postop shoe.  He will follow up with me in the office in 2 weeks for suture removal and to initiate range of motion at the lesser toe MP joints.  We will plan immobilization in the postop shoe for a month.  X-RAYS:  AP and lateral radiographs of the left foot were obtained intraoperatively.  These show interval shortening of the second and third metatarsals with appropriately positioned hardware.  No other acute injuries are noted.     Wylene Simmer, MD     JH/MEDQ  D:  10/24/2016  T:  10/24/2016  Job:  [id number]

## 2016-10-24 NOTE — Progress Notes (Signed)
Assisted Dr. Miller with left, ultrasound guided, popliteal block. Side rails up, monitors on throughout procedure. See vital signs in flow sheet. Tolerated Procedure well. 

## 2016-10-24 NOTE — Brief Op Note (Signed)
10/24/2016  1:23 PM  PATIENT:  Gilbert Perkins  46 y.o. male  PRE-OPERATIVE DIAGNOSIS:  Left 2-3 metarsalgia and Morton's neuroma  POST-OPERATIVE DIAGNOSIS:  Left 2-3 metarsalgia and Morton's neuroma  Procedure(s): 1.  Excision of left 2nd webspace Morton's neuroma 2.  Left 2nd MT Weil osteotomy 3.  Left 3rd MT Weil osteotomy 4.  Left foot AP and lateral xrays  SURGEON:  Wylene Simmer, MD  ASSISTANT: n/a  ANESTHESIA:   General, regional  EBL:  minimal   TOURNIQUET:   Total Tourniquet Time Documented: Calf (Left) - 26 minutes Total: Calf (Left) - 26 minutes  COMPLICATIONS:  None apparent  DISPOSITION:  Extubated, awake and stable to recovery.  DICTATION ID:  [id number]

## 2016-10-24 NOTE — Discharge Instructions (Addendum)
Gilbert Simmer, MD [place]  Please read the following information regarding your care after surgery.  Medications  You only need a prescription for the narcotic pain medicine (ex. oxycodone, Percocet, Norco).  All of the other medicines listed below are available over the counter. X acetominophen (Tylenol) 650 mg every 4-6 hours as you need for minor pain X oxycodone as prescribed for severe pain X Aleve 2 pills twice a day as needed for moderate pain   Narcotic pain medicine (ex. oxycodone, Percocet, Vicodin) will cause constipation.  To prevent this problem, take the following medicines while you are taking any pain medicine. ? docusate sodium (Colace) 100 mg twice a day ? senna (Senokot) 2 tablets twice a day  ? To help prevent blood clots, take a baby aspirin (81 mg) twice a day for two weeks after surgery.  You should also get up every hour while you are awake to move around.    Weight Bearing ? Bear weight when you are able on your operated leg or foot. X Bear weight on your operated foot in the post-op shoe. ? Do not bear any weight on the operated leg or foot.  Cast / Splint / Dressing X Keep your splint or cast clean and dry.  Dont put anything (coat hanger, pencil, etc) down inside of it.  If it gets damp, use a hair dryer on the cool setting to dry it.  If it gets soaked, call the office to schedule an appointment for a cast change. ? Remove your dressing 3 days after surgery and cover the incisions with dry dressings.    After your dressing, cast or splint is removed; you may shower, but do not soak or scrub the wound.  Allow the water to run over it, and then gently pat it dry.  Swelling It is normal for you to have swelling where you had surgery.  To reduce swelling and pain, keep your toes above your nose for at least 3 days after surgery.  It may be necessary to keep your foot or leg elevated for several weeks.  If it hurts, it should be elevated.  Follow  Up Call my office at [phone] when you are discharged from the hospital or surgery center to schedule an appointment to be seen two weeks after surgery.  Call my office at [phone] if you develop a fever >101.5 F, nausea, vomiting, bleeding from the surgical site or severe pain.    Regional Anesthesia Blocks  1. Numbness or the inability to move the "blocked" extremity may last from 3-48 hours after placement. The length of time depends on the medication injected and your individual response to the medication. If the numbness is not going away after 48 hours, call your surgeon.  2. The extremity that is blocked will need to be protected until the numbness is gone and the  Strength has returned. Because you cannot feel it, you will need to take extra care to avoid injury. Because it may be weak, you may have difficulty moving it or using it. You may not know what position it is in without looking at it while the block is in effect.  3. For blocks in the legs and feet, returning to weight bearing and walking needs to be done carefully. You will need to wait until the numbness is entirely gone and the strength has returned. You should be able to move your leg and foot normally before you try and bear weight or walk. You  will need someone to be with you when you first try to ensure you do not fall and possibly risk injury.  4. Bruising and tenderness at the needle site are common side effects and will resolve in a few days.  5. Persistent numbness or new problems with movement should be communicated to the surgeon or the [facility] [phone] [place] [phone]).   Post Anesthesia Home Care Instructions  Activity: Get plenty of rest for the remainder of the day. A responsible individual must stay with you for 24 hours following the procedure.  For the next 24 hours, DO NOT: -Drive a car -Paediatric nurse -Drink alcoholic beverages -Take any  medication unless instructed by your physician -Make any legal decisions or sign important papers.  Meals: Start with liquid foods such as gelatin or soup. Progress to regular foods as tolerated. Avoid greasy, spicy, heavy foods. If nausea and/or vomiting occur, drink only clear liquids until the nausea and/or vomiting subsides. Call your physician if vomiting continues.  Special Instructions/Symptoms: Your throat may feel dry or sore from the anesthesia or the breathing tube placed in your throat during surgery. If this causes discomfort, gargle with warm salt water. The discomfort should disappear within 24 hours.  If you had a scopolamine patch placed behind your ear for the management of post- operative nausea and/or vomiting:  1. The medication in the patch is effective for 72 hours, after which it should be removed.  Wrap patch in a tissue and discard in the trash. Wash hands thoroughly with soap and water. 2. You may remove the patch earlier than 72 hours if you experience unpleasant side effects which may include dry mouth, dizziness or visual disturbances. 3. Avoid touching the patch. Wash your hands with soap and water after contact with the patch.

## 2016-10-24 NOTE — Anesthesia Procedure Notes (Signed)
Procedure Name: LMA Insertion Date/Time: 10/24/2016 12:26 PM Performed by: Kazzandra Desaulniers D Pre-anesthesia Checklist: Patient identified, Emergency Drugs available, Suction available and Patient being monitored Patient Re-evaluated:Patient Re-evaluated prior to inductionOxygen Delivery Method: Circle system utilized Preoxygenation: Pre-oxygenation with 100% oxygen Intubation Type: IV induction Ventilation: Mask ventilation without difficulty LMA: LMA inserted LMA Size: 4.0 Number of attempts: 1 Airway Equipment and Method: Bite block Placement Confirmation: positive ETCO2 Tube secured with: Tape Dental Injury: Teeth and Oropharynx as per pre-operative assessment

## 2016-10-24 NOTE — Anesthesia Preprocedure Evaluation (Signed)
Anesthesia Evaluation  Patient identified by MRN, date of birth, ID band Patient awake    Reviewed: Allergy & Precautions, H&P , NPO status , Patient's Chart, lab work & pertinent test results  Airway Mallampati: II  TM Distance: >3 FB Neck ROM: Full    Dental no notable dental hx.    Pulmonary asthma , former smoker,    Pulmonary exam normal breath sounds clear to auscultation       Cardiovascular Normal cardiovascular exam Rhythm:Regular Rate:Normal     Neuro/Psych  Headaches,    GI/Hepatic   Endo/Other    Renal/GU      Musculoskeletal   Abdominal   Peds  Hematology   Anesthesia Other Findings   Reproductive/Obstetrics                             Anesthesia Physical  Anesthesia Plan  ASA: II  Anesthesia Plan: General   Post-op Pain Management: GA combined w/ Regional for post-op pain   Induction: Intravenous  PONV Risk Score and Plan: 2 and Ondansetron and Dexamethasone  Airway Management Planned: LMA  Additional Equipment:   Intra-op Plan:   Post-operative Plan: Extubation in OR  Informed Consent: I have reviewed the patients History and Physical, chart, labs and discussed the procedure including the risks, benefits and alternatives for the proposed anesthesia with the patient or authorized representative who has indicated his/her understanding and acceptance.     Plan Discussed with:   Anesthesia Plan Comments:         Anesthesia Quick Evaluation

## 2016-10-24 NOTE — Anesthesia Procedure Notes (Signed)
Anesthesia Regional Block: Popliteal block   Pre-Anesthetic Checklist: ,, timeout performed, Correct Patient, Correct Site, Correct Laterality, Correct Procedure, Correct Position, site marked, Risks and benefits discussed,  Surgical consent,  Pre-op evaluation,  At surgeon's request and post-op pain management  Laterality: Left  Prep: Dura Prep       Needles:  Injection technique: Single-shot  Needle Type: Stimiplex     Needle Length: 9cm  Needle Gauge: 21     Additional Needles:   Procedures: ultrasound guided,,,,,,,,  Narrative:  Start time: 10/24/2016 12:13 PM End time: 10/24/2016 12:18 PM Injection made incrementally with aspirations every 5 mL.  Performed by: Personally  Anesthesiologist: Candida Peeling RAY

## 2016-10-25 ENCOUNTER — Encounter (HOSPITAL_BASED_OUTPATIENT_CLINIC_OR_DEPARTMENT_OTHER): Payer: Self-pay | Admitting: Orthopedic Surgery

## 2016-12-02 ENCOUNTER — Encounter: Payer: Self-pay | Admitting: Internal Medicine

## 2016-12-02 DIAGNOSIS — R0683 Snoring: Secondary | ICD-10-CM

## 2016-12-06 DIAGNOSIS — G5762 Lesion of plantar nerve, left lower limb: Secondary | ICD-10-CM | POA: Diagnosis not present

## 2016-12-06 DIAGNOSIS — M216X2 Other acquired deformities of left foot: Secondary | ICD-10-CM | POA: Diagnosis not present

## 2016-12-19 ENCOUNTER — Other Ambulatory Visit: Payer: Self-pay | Admitting: Internal Medicine

## 2016-12-19 NOTE — Telephone Encounter (Signed)
[place] Controlled Substance Database checked. Last filled on 11/13/16 

## 2016-12-19 NOTE — Telephone Encounter (Signed)
Ok to fill. printed 

## 2016-12-23 NOTE — Telephone Encounter (Signed)
RX faxed on 12/20/16

## 2017-01-10 DIAGNOSIS — G5762 Lesion of plantar nerve, left lower limb: Secondary | ICD-10-CM | POA: Diagnosis not present

## 2017-01-10 DIAGNOSIS — M216X2 Other acquired deformities of left foot: Secondary | ICD-10-CM | POA: Diagnosis not present

## 2017-02-21 ENCOUNTER — Other Ambulatory Visit: Payer: Self-pay | Admitting: Emergency Medicine

## 2017-02-21 MED ORDER — ACYCLOVIR 400 MG PO TABS: 400.0000 mg | ORAL_TABLET | Freq: Two times a day (BID) | ORAL | 1 refills | Status: DC

## 2017-02-27 ENCOUNTER — Other Ambulatory Visit: Payer: Self-pay | Admitting: Internal Medicine

## 2017-02-27 NOTE — Telephone Encounter (Signed)
[place] Controlled Substance Database checked. Last filled on 01/19/17

## 2017-02-28 NOTE — Telephone Encounter (Signed)
RX faxed to [facility] 

## 2017-03-10 ENCOUNTER — Ambulatory Visit: Payer: 59 | Admitting: Internal Medicine

## 2017-03-10 ENCOUNTER — Encounter: Payer: Self-pay | Admitting: Internal Medicine

## 2017-03-10 VITALS — BP 130/88 | HR 87 | Ht 71.0 in | Wt 249.1 lb

## 2017-03-10 DIAGNOSIS — G4733 Obstructive sleep apnea (adult) (pediatric): Secondary | ICD-10-CM | POA: Diagnosis not present

## 2017-03-10 DIAGNOSIS — J310 Chronic rhinitis: Secondary | ICD-10-CM | POA: Diagnosis not present
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
## 2017-03-10 NOTE — Patient Instructions (Signed)
Order- schedule unattended home sleep test    Dx OSA  Please call me about 2 weeks after the study for results and recommendation. We may be able to go ahead and start therapy like CPAP or an oral appliance then, before you come back to see me.  Please call as needed.

## 2017-03-11 DIAGNOSIS — J310 Chronic rhinitis: Secondary | ICD-10-CM | POA: Insufficient documentation
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
## 2017-03-11 NOTE — Assessment & Plan Note (Signed)
Tentative dx, history and physical.  Since his negative study 13, he has gained weight. Plan-schedule sleep study we had reviewed medical issues of obstructive sleep apnea and available treatments.  If appropriate, we will probably order CPAP

## 2017-04-13 DIAGNOSIS — G4733 Obstructive sleep apnea (adult) (pediatric): Secondary | ICD-10-CM | POA: Diagnosis not present

## 2017-04-14 DIAGNOSIS — G4733 Obstructive sleep apnea (adult) (pediatric): Secondary | ICD-10-CM | POA: Diagnosis not present

## 2017-04-18 ENCOUNTER — Other Ambulatory Visit: Payer: Self-pay | Admitting: *Deleted

## 2017-04-18 DIAGNOSIS — G4733 Obstructive sleep apnea (adult) (pediatric): Secondary | ICD-10-CM

## 2017-05-10 ENCOUNTER — Other Ambulatory Visit: Payer: Self-pay | Admitting: Internal Medicine

## 2017-05-12 NOTE — Telephone Encounter (Signed)
[place] Controlled Substance Database checked. Last filled on 03/28/17

## 2017-05-28 ENCOUNTER — Encounter: Payer: Self-pay | Admitting: Internal Medicine

## 2017-05-28 ENCOUNTER — Other Ambulatory Visit: Payer: Self-pay | Admitting: Internal Medicine

## 2017-05-28 DIAGNOSIS — G4733 Obstructive sleep apnea (adult) (pediatric): Secondary | ICD-10-CM

## 2017-05-28 NOTE — Telephone Encounter (Signed)
His home sleep test confirmed obstructive sleep apnea, averaging 13 apneas/ hour, with drops in blood oxygen level.   I recommend we start with order: new DME, new CPAP auto 5-20, mask of choice, humidifier, supplies, AirView    For dx OSA  Please make sure he has a f/u appointment between 31 and 90 days after getting his CPAP, per insurance regs.   May need to change his current pending appointment to fit this window.

## 2017-05-28 NOTE — Telephone Encounter (Signed)
CY please advise. Thanks.     To: [place] PO[facility]    From: ADEKUNLE ROHRBACH    Created: 05/28/2017 12:08 PM      Hey Doc.  I saw the test results for the sleep study we conducted and just wanted to follow up with you about them. I know I have a follow-up appointment next month, but wondered what we needed to do to get going with a CPAP or whatever your recommendation is for my snoring / sleep apnea...or just wait till the next appointment.??? Just let me know when you get a chance.   Thank you.  Gilbert Perkins  [phone]

## 2017-05-29 NOTE — Addendum Note (Signed)
Addended by: Collier Salina on: 05/29/2017 04:25 PM   Modules accepted: Orders

## 2017-06-09 DIAGNOSIS — G4733 Obstructive sleep apnea (adult) (pediatric): Secondary | ICD-10-CM | POA: Diagnosis not present

## 2017-06-16 ENCOUNTER — Ambulatory Visit: Payer: Self-pay | Admitting: Internal Medicine

## 2017-07-07 DIAGNOSIS — G4733 Obstructive sleep apnea (adult) (pediatric): Secondary | ICD-10-CM | POA: Diagnosis not present

## 2017-07-19 ENCOUNTER — Other Ambulatory Visit: Payer: Self-pay | Admitting: Internal Medicine

## 2017-07-27 ENCOUNTER — Encounter: Payer: Self-pay | Admitting: Internal Medicine

## 2017-07-29 ENCOUNTER — Encounter: Payer: Self-pay | Admitting: Internal Medicine

## 2017-07-29 ENCOUNTER — Ambulatory Visit: Payer: 59 | Admitting: Internal Medicine

## 2017-07-29 VITALS — BP 142/80 | HR 88 | Ht 71.0 in | Wt 254.6 lb

## 2017-07-29 DIAGNOSIS — G4733 Obstructive sleep apnea (adult) (pediatric): Secondary | ICD-10-CM | POA: Diagnosis not present

## 2017-07-29 NOTE — Assessment & Plan Note (Signed)
Good initial experience with CPAP.  He is benefiting-sleeping better and snoring is stopped.  Download confirms. Plan-reduce AutoPap range to 5-12.  Discussed mask fit options.

## 2017-07-29 NOTE — Patient Instructions (Signed)
Order- DME [facility]  Please change auto range to 5-12, continue mask of choice humidifier, supplies, AirView  Please call if we can help

## 2017-07-29 NOTE — Progress Notes (Signed)
03/10/17-47 year old male former smoker for sleep evaluation.  Referred courtesy of Dr. Quay Burow  NPSG 05/13/11-negative for OSA, AHI 1.4/hour, no significant desaturation, body weight 230 pounds Medical problem list includes asthma, snoring obesity Works as a Merchandiser, retail job.  His wife tells him he snores loudly and wakes up gasping for breath with witnessed apneas.  He admits daytime sleepiness if he sits quietly.  He has been somewhat improved by using an old borrowed CPAP machine at unknown pressure but says it seems to help. Previous nasal fracture repaired.  No active heart or lung disease.  He has gained 19 pounds since his original sleep study. [facility] 18/24  07/29/17-47 year old male former smoker followed for OSA complicated by asthma, obesity HST/9/18-AHI 13.1/hour, desaturation to 85%, body weight 249 pounds CPAP auto 5-20/[facility]>> 5-12 today ----OSA: DME [facility]. Pt wears CPAP just about nighlty for at least 5 hours; DL attached. No new supplies needed at this time.  Girlfriend tells him CPAP has stopped his snoring.  He feels he is sleeping well with it but sometimes pressure gets too high.  Download 83% compliance AHI 3.5/hour-reviewed with him. He denies other active health issues or changes.  ROS-see HPI   + = positive Constitutional:    weight loss, night sweats, fevers, chills, fatigue, lassitude. HEENT:    +headaches, +difficulty swallowing, tooth/dental problems, sore throat,       sneezing, itching, ear ache, +nasal congestion, post nasal drip, snoring CV:    chest pain, orthopnea, PND, swelling in lower extremities, anasarca,                                                     dizziness, palpitations Resp:   shortness of breath with exertion or at rest.                productive cough,   non-productive cough, coughing up of blood.              change in color of mucus.  wheezing.   Skin:    rash or lesions. GI:  No-   heartburn, indigestion, abdominal pain,  nausea, vomiting, diarrhea,                 change in bowel habits, loss of appetite GU: dysuria, change in color of urine, no urgency or frequency.   flank pain. MS:   joint pain, stiffness, decreased range of motion, back pain. Neuro-     nothing unusual Psych:  change in mood or affect.  depression or anxiety.   memory loss.  OBJ- Physical Exam General- Alert, Oriented, Affect-appropriate, Distress- none acute,+ muscular Skin- rash-none, lesions- none, excoriation- none Lymphadenopathy- none Head- atraumatic            Eyes- Gross vision intact, PERRLA, conjunctivae and secretions clear            Ears- Hearing, canals-normal            Nose-+ turbinate edema, + mild septal dev, mucus, polyps, erosion, perforation             Throat- Mallampati III-IV , mucosa clear , drainage- none, tonsils- atrophic Neck- flexible , trachea midline, no stridor , thyroid nl, carotid no bruit Chest - symmetrical excursion , unlabored           Heart/CV- RRR , no murmur ,  no gallop  , no rub, nl s1 s2                           - JVD- none , edema- none, stasis changes- none, varices- none           Lung- clear to P&A, wheeze- none, cough- none , dullness-none, rub- none           Chest wall-  Abd-  Br/ Gen/ Rectal- Not done, not indicated Extrem- cyanosis- none, clubbing, none, atrophy- none, strength- nl Neuro- grossly intact to observation

## 2017-08-07 DIAGNOSIS — G4733 Obstructive sleep apnea (adult) (pediatric): Secondary | ICD-10-CM | POA: Diagnosis not present

## 2017-09-02 DIAGNOSIS — H1045 Other chronic allergic conjunctivitis: Secondary | ICD-10-CM | POA: Diagnosis not present

## 2017-09-06 DIAGNOSIS — G4733 Obstructive sleep apnea (adult) (pediatric): Secondary | ICD-10-CM | POA: Diagnosis not present

## 2017-09-08 ENCOUNTER — Other Ambulatory Visit: Payer: Self-pay | Admitting: Internal Medicine

## 2017-09-08 DIAGNOSIS — N529 Male erectile dysfunction, unspecified: Secondary | ICD-10-CM

## 2017-09-08 DIAGNOSIS — G4733 Obstructive sleep apnea (adult) (pediatric): Secondary | ICD-10-CM | POA: Diagnosis not present

## 2017-09-28 ENCOUNTER — Other Ambulatory Visit: Payer: Self-pay | Admitting: Internal Medicine

## 2017-10-07 DIAGNOSIS — G4733 Obstructive sleep apnea (adult) (pediatric): Secondary | ICD-10-CM | POA: Diagnosis not present

## 2017-10-09 ENCOUNTER — Encounter: Payer: Self-pay | Admitting: Internal Medicine

## 2017-11-06 DIAGNOSIS — G4733 Obstructive sleep apnea (adult) (pediatric): Secondary | ICD-10-CM | POA: Diagnosis not present
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
## 2017-11-14 ENCOUNTER — Ambulatory Visit: Payer: 59 | Admitting: Internal Medicine

## 2017-11-14 ENCOUNTER — Encounter: Payer: Self-pay | Admitting: Internal Medicine

## 2017-11-14 ENCOUNTER — Other Ambulatory Visit (INDEPENDENT_AMBULATORY_CARE_PROVIDER_SITE_OTHER): Payer: 59

## 2017-11-14 VITALS — BP 112/76 | HR 92 | Temp 98.0°F | Resp 16 | Wt 242.0 lb

## 2017-11-14 DIAGNOSIS — I861 Scrotal varices: Secondary | ICD-10-CM | POA: Insufficient documentation

## 2017-11-14 DIAGNOSIS — E782 Mixed hyperlipidemia: Secondary | ICD-10-CM

## 2017-11-14 DIAGNOSIS — N529 Male erectile dysfunction, unspecified: Secondary | ICD-10-CM | POA: Diagnosis not present

## 2017-11-14 DIAGNOSIS — Z0000 Encounter for general adult medical examination without abnormal findings: Secondary | ICD-10-CM | POA: Diagnosis not present

## 2017-11-14 DIAGNOSIS — F419 Anxiety disorder, unspecified: Secondary | ICD-10-CM | POA: Diagnosis not present

## 2017-11-14 DIAGNOSIS — R7309 Other abnormal glucose: Secondary | ICD-10-CM

## 2017-11-14 DIAGNOSIS — J4520 Mild intermittent asthma, uncomplicated: Secondary | ICD-10-CM

## 2017-11-14 DIAGNOSIS — A6000 Herpesviral infection of urogenital system, unspecified: Secondary | ICD-10-CM

## 2017-11-14 LAB — COMPREHENSIVE METABOLIC PANEL
ALT: 45 U/L (ref 0–53)
AST: 40 U/L — ABNORMAL HIGH (ref 0–37)
Albumin: 4.3 g/dL (ref 3.5–5.2)
Alkaline Phosphatase: 57 U/L (ref 39–117)
BUN: 12 mg/dL (ref 6–23)
CO2: 31 mEq/L (ref 19–32)
Calcium: 9.2 mg/dL (ref 8.4–10.5)
Chloride: 103 mEq/L (ref 96–112)
Creatinine, Ser: 1.16 mg/dL (ref 0.40–1.50)
GFR: 71.01 mL/min (ref >60.00)
Glucose, Bld: 111 mg/dL — ABNORMAL HIGH (ref 70–99)
Potassium: 4.5 mEq/L (ref 3.5–5.1)
Sodium: 141 mEq/L (ref 135–145)
Total Bilirubin: 0.4 mg/dL (ref 0.2–1.2)
Total Protein: 6.9 g/dL (ref 6.0–8.3)

## 2017-11-14 LAB — CBC WITH DIFFERENTIAL/PLATELET
Basophils Absolute: 0 10*3/uL (ref 0.0–0.1)
Basophils Relative: 0.9 % (ref 0.0–3.0)
Eosinophils Absolute: 0.5 10*3/uL (ref 0.0–0.7)
Eosinophils Relative: 9.6 % — ABNORMAL HIGH (ref 0.0–5.0)
HCT: 47.7 % (ref 39.0–52.0)
Hemoglobin: 16 g/dL (ref 13.0–17.0)
Lymphocytes Relative: 22.6 % (ref 12.0–46.0)
Lymphs Abs: 1.2 10*3/uL (ref 0.7–4.0)
MCHC: 33.6 g/dL (ref 30.0–36.0)
MCV: 86.2 fl (ref 78.0–100.0)
Monocytes Absolute: 0.8 10*3/uL (ref 0.1–1.0)
Monocytes Relative: 14.8 % — ABNORMAL HIGH (ref 3.0–12.0)
Neutro Abs: 2.7 10*3/uL (ref 1.4–7.7)
Neutrophils Relative %: 52.1 % (ref 43.0–77.0)
Platelets: 277 10*3/uL (ref 150.0–400.0)
RBC: 5.53 Mil/uL (ref 4.22–5.81)
RDW: 13.8 % (ref 11.5–15.5)
WBC: 5.1 10*3/uL (ref 4.0–10.5)

## 2017-11-14 LAB — LDL CHOLESTEROL, DIRECT: Direct LDL: 147 mg/dL

## 2017-11-14 LAB — LIPID PANEL
Cholesterol: 211 mg/dL — ABNORMAL HIGH (ref 0–200)
HDL: 28.8 mg/dL — ABNORMAL LOW (ref >39.00)
NonHDL: 181.89
Total CHOL/HDL Ratio: 7
Triglycerides: 257 mg/dL — ABNORMAL HIGH (ref 0.0–149.0)
VLDL: 51.4 mg/dL — ABNORMAL HIGH (ref 0.0–40.0)

## 2017-11-14 LAB — HEMOGLOBIN A1C: Hgb A1c MFr Bld: 5.8 % (ref 4.6–6.5)

## 2017-11-14 LAB — TSH: TSH: 0.64 u[IU]/mL (ref 0.35–4.50)

## 2017-11-14 MED ORDER — ALPRAZOLAM 0.5 MG PO TABS: 0.5000 mg | ORAL_TABLET | Freq: Every day | ORAL | 0 refills | Status: DC

## 2017-11-14 MED ORDER — ACYCLOVIR 400 MG PO TABS: 400.0000 mg | ORAL_TABLET | Freq: Three times a day (TID) | ORAL | 11 refills | Status: DC

## 2017-11-14 MED ORDER — ALBUTEROL SULFATE HFA 108 (90 BASE) MCG/ACT IN AERS
INHALATION_SPRAY | RESPIRATORY_TRACT | 11 refills | Status: AC
Start: 2017-11-14 — End: ?

## 2017-11-14 MED ORDER — TADALAFIL 20 MG PO TABS: ORAL_TABLET | ORAL | 5 refills | Status: DC

## 2017-11-14 NOTE — Patient Instructions (Addendum)
  Test(s) ordered today. Your results will be released to [facility] (or called to you) after review, usually within 72hours after test completion. If any changes need to be made, you will be notified at that same time.  All other Health Maintenance issues reviewed.   All recommended immunizations and age-appropriate screenings are up-to-date or discussed.  No immunizations administered today.   Medications reviewed and updated.  No changes recommended at this time.  Your prescription(s) have been submitted to your pharmacy. Please take as directed and contact our office if you believe you are having problem(s) with the medication(s).   Please followup in one year

## 2017-11-14 NOTE — Assessment & Plan Note (Signed)
History of elevated cholesterol Check CMP, TSH and lipid panel

## 2017-11-14 NOTE — Assessment & Plan Note (Signed)
Taking Cialis as needed-works fairly well Refilled today

## 2017-11-14 NOTE — Assessment & Plan Note (Signed)
Takes xanax only as needed at bedtime Ok to continue

## 2017-11-14 NOTE — Assessment & Plan Note (Signed)
Takes acyclovir as needed-due for refill Refilled today

## 2017-11-14 NOTE — Assessment & Plan Note (Signed)
Mild, intermittent Continue albuterol inhaler as needed

## 2017-11-14 NOTE — Assessment & Plan Note (Signed)
Check A1c Continue regular exercise and working on weight loss

## 2017-11-16 ENCOUNTER — Encounter: Payer: Self-pay | Admitting: Internal Medicine

## 2017-11-16 DIAGNOSIS — E782 Mixed hyperlipidemia: Secondary | ICD-10-CM

## 2017-11-18 MED ORDER — ROSUVASTATIN CALCIUM 10 MG PO TABS: 10.0000 mg | ORAL_TABLET | Freq: Every day | ORAL | 3 refills | Status: DC

## 2017-12-07 DIAGNOSIS — G4733 Obstructive sleep apnea (adult) (pediatric): Secondary | ICD-10-CM | POA: Diagnosis not present

## 2017-12-09 DIAGNOSIS — G4733 Obstructive sleep apnea (adult) (pediatric): Secondary | ICD-10-CM | POA: Diagnosis not present

## 2017-12-16 ENCOUNTER — Other Ambulatory Visit: Payer: Self-pay | Admitting: Internal Medicine

## 2017-12-16 NOTE — Telephone Encounter (Signed)
LOV  11/14/17   [place] controlled substance database checked.  Ok to fill medication.

## 2018-01-07 DIAGNOSIS — G4733 Obstructive sleep apnea (adult) (pediatric): Secondary | ICD-10-CM | POA: Diagnosis not present

## 2018-01-14 ENCOUNTER — Other Ambulatory Visit: Payer: Self-pay | Admitting: Internal Medicine

## 2018-01-15 NOTE — Telephone Encounter (Signed)
Last refill was 12/16/17.  Last OV was 11/14/17 Next OV is not made

## 2018-01-29 ENCOUNTER — Ambulatory Visit: Payer: Self-pay | Admitting: Internal Medicine

## 2018-02-06 DIAGNOSIS — G4733 Obstructive sleep apnea (adult) (pediatric): Secondary | ICD-10-CM | POA: Diagnosis not present

## 2018-03-09 DIAGNOSIS — G4733 Obstructive sleep apnea (adult) (pediatric): Secondary | ICD-10-CM | POA: Diagnosis not present

## 2018-03-31 ENCOUNTER — Encounter: Payer: Self-pay | Admitting: Internal Medicine

## 2018-03-31 ENCOUNTER — Ambulatory Visit: Payer: 59 | Admitting: Internal Medicine

## 2018-03-31 VITALS — BP 122/76 | HR 89 | Ht 71.0 in | Wt 244.6 lb

## 2018-03-31 DIAGNOSIS — J310 Chronic rhinitis: Secondary | ICD-10-CM | POA: Diagnosis not present

## 2018-03-31 DIAGNOSIS — G4733 Obstructive sleep apnea (adult) (pediatric): Secondary | ICD-10-CM

## 2018-03-31 NOTE — Patient Instructions (Signed)
Order- referral to [facility] ENT dx Chronic Rhinitis, OSA  Try to absolutely minimize use of decongestant nasal sprays  Use otc Flonase, 2 sprays each nostril every night at bedtime  Order- dME [facility]- please try Full face mask to improve compliance. Continue CPAP auto 5-12, mask of choice, humidifier, AirView

## 2018-03-31 NOTE — Progress Notes (Signed)
HPI male former smoker followed for OSA complicated by asthma, obesity HST/9/18-AHI 13.1/hour, desaturation to 85%, body weight 249 pounds  -----------------------------------------------------------------------------  07/29/17-47 year old male former smoker followed for OSA complicated by asthma, obesity HST/9/18-AHI 13.1/hour, desaturation to 85%, body weight 249 pounds CPAP auto 5-20/[facility]>> 5-12 today ----OSA: DME [facility]. Pt wears CPAP just about nighlty for at least 5 hours; DL attached. No new supplies needed at this time.  Girlfriend tells him CPAP has stopped his snoring.  He feels he is sleeping well with it but sometimes pressure gets too high.  Download 83% compliance AHI 3.5/hour-reviewed with him. He denies other active health issues or changes.  03/31/2018- 47 year old male former smoker followed for OSA complicated by asthma, obesity CPAP auto 5-12/[facility] -----6 month follow up for OSA. States that he will take his mask off at 3am daily. Uses [facility] as his DME. Has some nasal congestion.  Download 10% compliance, mostly missed days, AHI 3.0/hour. Complains of perennial nasal congestion preventing use of CPAP daily use of decongestant sprays.  ROS-see HPI   + = positive Constitutional:    weight loss, night sweats, fevers, chills, fatigue, lassitude. HEENT:    +headaches, +difficulty swallowing, tooth/dental problems, sore throat,       sneezing, itching, ear ache, +nasal congestion, post nasal drip, snoring CV:    chest pain, orthopnea, PND, swelling in lower extremities, anasarca,                                                     dizziness, palpitations Resp:   shortness of breath with exertion or at rest.                productive cough,   non-productive cough, coughing up of blood.              change in color of mucus.  wheezing.   Skin:    rash or lesions. GI:  No-   heartburn, indigestion, abdominal pain, nausea, vomiting, diarrhea,                 change  in bowel habits, loss of appetite GU: dysuria, change in color of urine, no urgency or frequency.   flank pain. MS:   joint pain, stiffness, decreased range of motion, back pain. Neuro-     nothing unusual Psych:  change in mood or affect.  depression or anxiety.   memory loss.  OBJ- Physical Exam General- Alert, Oriented, Affect-appropriate, Distress- none acute,+ muscular Skin- rash-none, lesions- none, excoriation- none Lymphadenopathy- none Head- atraumatic            Eyes- Gross vision intact, PERRLA, conjunctivae and secretions clear            Ears- Hearing, canals-normal            Nose-+ turbinate edema, + mild septal dev, +mucus, polyps, erosion, perforation             Throat- Mallampati III-IV , mucosa clear , drainage- none, tonsils- atrophic Neck- flexible , trachea midline, no stridor , thyroid nl, carotid no bruit Chest - symmetrical excursion , unlabored           Heart/CV- RRR , no murmur , no gallop  , no rub, nl s1 s2                           -  JVD- none , edema- none, stasis changes- none, varices- none           Lung- clear to P&A, wheeze- none, cough- none , dullness-none, rub- none           Chest wall-  Abd-  Br/ Gen/ Rectal- Not done, not indicated Extrem- cyanosis- none, clubbing, none, atrophy- none, strength- nl Neuro- grossly intact to observation

## 2018-04-13 DIAGNOSIS — J342 Deviated nasal septum: Secondary | ICD-10-CM | POA: Diagnosis not present

## 2018-04-13 DIAGNOSIS — J343 Hypertrophy of nasal turbinates: Secondary | ICD-10-CM | POA: Diagnosis not present

## 2018-04-13 DIAGNOSIS — J3489 Other specified disorders of nose and nasal sinuses: Secondary | ICD-10-CM | POA: Diagnosis not present

## 2018-05-06 ENCOUNTER — Other Ambulatory Visit: Payer: Self-pay | Admitting: Internal Medicine

## 2018-05-06 HISTORY — PX: COLOSTOMY: SHX63

## 2018-05-07 ENCOUNTER — Other Ambulatory Visit: Payer: Self-pay | Admitting: Internal Medicine

## 2018-05-07 DIAGNOSIS — N529 Male erectile dysfunction, unspecified: Secondary | ICD-10-CM

## 2018-05-07 NOTE — Telephone Encounter (Signed)
[place] Controlled Database Checked Last filled:01/15/18 # 30 LOV w/you: 11/14/17 Next appt w/you: None

## 2018-06-07 NOTE — Assessment & Plan Note (Signed)
We will ask ENT evaluation of his nasal congestion complaint.  I suspect he is going to need alternatives to CPAP and we have begun that discussion.  Choice depends partly on what ENT thinks of his nasal airway.

## 2018-06-07 NOTE — Assessment & Plan Note (Addendum)
This is likely bothersome but not a sufficient reason to fail CPAP.  I will give him the benefit of the doubt and see if we can improve his nasal airway by requesting ENT evaluation and change to a full facemask. Continue CPAP auto 5-12.  Educated on compliance goals.  If he can't become a successful CPAP user, we will talk some more about alternatives.

## 2018-07-27 ENCOUNTER — Telehealth: Payer: 59 | Admitting: Family

## 2018-07-27 DIAGNOSIS — B9689 Other specified bacterial agents as the cause of diseases classified elsewhere: Secondary | ICD-10-CM | POA: Diagnosis not present

## 2018-07-27 DIAGNOSIS — J208 Acute bronchitis due to other specified organisms: Secondary | ICD-10-CM

## 2018-07-27 MED ORDER — DOXYCYCLINE HYCLATE 100 MG PO TABS: 100.0000 mg | ORAL_TABLET | Freq: Two times a day (BID) | ORAL | 0 refills | Status: DC

## 2018-07-27 MED ORDER — PREDNISONE 10 MG (21) PO TBPK: ORAL_TABLET | ORAL | 0 refills | Status: DC

## 2018-07-27 NOTE — Progress Notes (Signed)
We are sorry that you are not feeling well.  Here is how we plan to help!  Based on your presentation I believe you most likely have A cough due to bacteria.  When patients have a fever and a productive cough with a change in color or increased sputum production, we are concerned about bacterial bronchitis.  If left untreated it can progress to pneumonia.  If your symptoms do not improve with your treatment plan it is important that you contact your provider.   I have prescribed Doxycycline 100 mg twice a day for 7 days     In addition you may use A non-prescription cough medication called Mucinex DM: take 2 tablets every 12 hours.  Prednisone 5 mg daily for 6 days (see taper instructions below) Prednisone 10 mg daily for 6 days (see taper instructions below)  From your responses in the eVisit questionnaire you describe inflammation in the upper respiratory tract which is causing a significant cough.  This is commonly called Bronchitis and has four common causes:    Allergies  Viral Infections  Acid Reflux  Bacterial Infection Allergies, viruses and acid reflux are treated by controlling symptoms or eliminating the cause. An example might be a cough caused by taking certain blood pressure medications. You stop the cough by changing the medication. Another example might be a cough caused by acid reflux. Controlling the reflux helps control the cough.  USE OF BRONCHODILATOR ("RESCUE") INHALERS: There is a risk from using your bronchodilator too frequently.  The risk is that over-reliance on a medication which only relaxes the muscles surrounding the breathing tubes can reduce the effectiveness of medications prescribed to reduce swelling and congestion of the tubes themselves.  Although you feel brief relief from the bronchodilator inhaler, your asthma may actually be worsening with the tubes becoming more swollen and filled with mucus.  This can delay other crucial treatments, such as oral  steroid medications. If you need to use a bronchodilator inhaler daily, several times per day, you should discuss this with your provider.  There are probably better treatments that could be used to keep your asthma under control.     HOME CARE . Only take medications as instructed by your medical team. . Complete the entire course of an antibiotic. . Drink plenty of fluids and get plenty of rest. . Avoid close contacts especially the very young and the elderly . Cover your mouth if you cough or cough into your sleeve. . Always remember to wash your hands . A steam or ultrasonic humidifier can help congestion.   GET HELP RIGHT AWAY IF: . You develop worsening fever. . You become short of breath . You cough up blood. . Your symptoms persist after you have completed your treatment plan MAKE SURE YOU   Understand these instructions.  Will watch your condition.  Will get help right away if you are not doing well or get worse.  Your e-visit answers were reviewed by a board certified advanced clinical practitioner to complete your personal care plan.  Depending on the condition, your plan could have included both over the counter or prescription medications. If there is a problem please reply  once you have received a response from your provider. Your safety is important to [place].  If you have drug allergies check your prescription carefully.    You can use MyChart to ask questions about today's visit, request a non-urgent call back, or ask for a work or school excuse for 24  hours related to this e-Visit. If it has been greater than 24 hours you will need to follow up with your provider, or enter a new e-Visit to address those concerns. You will get an e-mail in the next two days asking about your experience.  I hope that your e-visit has been valuable and will speed your recovery. Thank you for using e-visits.

## 2018-07-30 ENCOUNTER — Ambulatory Visit: Payer: Self-pay | Admitting: Internal Medicine

## 2018-09-09 DIAGNOSIS — G4733 Obstructive sleep apnea (adult) (pediatric): Secondary | ICD-10-CM | POA: Diagnosis not present

## 2018-11-04 ENCOUNTER — Other Ambulatory Visit: Payer: Self-pay | Admitting: Internal Medicine

## 2018-11-09 ENCOUNTER — Ambulatory Visit: Payer: Self-pay | Admitting: Internal Medicine

## 2018-11-26 ENCOUNTER — Ambulatory Visit: Payer: 59 | Admitting: Internal Medicine

## 2018-11-26 ENCOUNTER — Other Ambulatory Visit: Payer: Self-pay

## 2018-11-26 ENCOUNTER — Encounter: Payer: Self-pay | Admitting: Internal Medicine

## 2018-11-26 VITALS — BP 116/80 | HR 84 | Temp 98.1°F | Ht 71.0 in | Wt 246.6 lb

## 2018-11-26 DIAGNOSIS — G4733 Obstructive sleep apnea (adult) (pediatric): Secondary | ICD-10-CM | POA: Diagnosis not present

## 2018-11-26 DIAGNOSIS — J310 Chronic rhinitis: Secondary | ICD-10-CM | POA: Diagnosis not present

## 2018-11-26 MED ORDER — ACYCLOVIR 400 MG PO TABS: 400.0000 mg | ORAL_TABLET | Freq: Three times a day (TID) | ORAL | 0 refills | Status: DC

## 2018-11-26 NOTE — Patient Instructions (Signed)
Order- DME [facility]- please provide chin strap.  Continue CPAP auto 5-12, mask of choice, humidifier, supplies, Airview/ card  Order- referral to daytime sleep center tech    CPAP mask fitting      Please try to minimize the use of decongestant nasal sprays. Use otc nasal saline spray, nasal saline gel, Flonase nasal spray and Breathe Right nasal strips as alternatives  Please call if we can help

## 2018-11-26 NOTE — Assessment & Plan Note (Signed)
He understands his use of nasal decongestants has created this problem. Discuss ways to wean off.

## 2018-11-26 NOTE — Assessment & Plan Note (Signed)
Benefits from CPAP when he can use it. Nasal congestion continues to interfere. Plan- Try chin strap. Refer for mask fitting at [facility].

## 2018-11-26 NOTE — Progress Notes (Signed)
HPI male former smoker followed for OSA complicated by asthma, obesity HST 04/13/17-AHI 13.1/hour, desaturation to 85%, body weight 249 pounds  -----------------------------------------------------------------------------  03/31/2018- 48 year old male former smoker followed for OSA complicated by asthma, obesity CPAP auto 5-12/[facility] -----6 month follow up for OSA. States that he will take his mask off at 3am daily. Uses [facility] as his DME. Has some nasal congestion.  Download 10% compliance, mostly missed days, AHI 3.0/hour. Complains of perennial nasal congestion preventing use of CPAP daily use of decongestant sprays.  11/26/2018- 48 year old male former smoker followed for OSA complicated by asthma, obesity, Rhinitis medicamentosa CPAP auto 5-12/[facility] Download compliance 57%, AHI 2.7/ hr Wife on speaker phone during visit Body weight today 246 lbs Albuterol hfa, Xanax 0.5 mg at hs Uses nasal pillows due to facial hair. Wife reports he snores and stops breathing- certainly when he takes mask off in his sleep. Nasal congestion interferes still. She would like him to try a full face mask, if it will seal. I suggested chin strap. ENT told him to stop using decongestant nasal sprays, as expected. He continues.   ROS-see HPI   + = positive Constitutional:    weight loss, night sweats, fevers, chills, fatigue, lassitude. HEENT:    +headaches, +difficulty swallowing, tooth/dental problems, sore throat,       sneezing, itching, ear ache, +nasal congestion, post nasal drip, +snoring CV:    chest pain, orthopnea, PND, swelling in lower extremities, anasarca,                                                     dizziness, palpitations Resp:   shortness of breath with exertion or at rest.                productive cough,   non-productive cough, coughing up of blood.              change in color of mucus.  wheezing.   Skin:    rash or lesions. GI:  No-   heartburn, indigestion, abdominal  pain, nausea, vomiting, diarrhea,                 change in bowel habits, loss of appetite GU: dysuria, change in color of urine, no urgency or frequency.   flank pain. MS:   joint pain, stiffness, decreased range of motion, back pain. Neuro-     nothing unusual Psych:  change in mood or affect.  depression or anxiety.   memory loss.  OBJ- Physical Exam General- Alert, Oriented, Affect-appropriate, Distress- none acute,+ muscular Skin- rash-none, lesions- none, excoriation- none Lymphadenopathy- none Head- atraumatic            Eyes- Gross vision intact, PERRLA, conjunctivae and secretions clear            Ears- Hearing, canals-normal            Nose-+ turbinate edema, + mild septal dev, +mucus, polyps, erosion, perforation             Throat- Mallampati III-IV , mucosa clear , drainage- none, tonsils- atrophic Neck- flexible , trachea midline, no stridor , thyroid nl, carotid no bruit Chest - symmetrical excursion , unlabored           Heart/CV- RRR , no murmur , no gallop  , no rub, nl s1 s2                           -  JVD- none , edema- none, stasis changes- none, varices- none           Lung- clear to P&A, wheeze- none, cough- none , dullness-none, rub- none           Chest wall-  Abd-  Br/ Gen/ Rectal- Not done, not indicated Extrem- cyanosis- none, clubbing, none, atrophy- none, strength- nl Neuro- grossly intact to observation

## 2018-12-04 ENCOUNTER — Other Ambulatory Visit: Payer: Self-pay | Admitting: Internal Medicine

## 2018-12-11 ENCOUNTER — Other Ambulatory Visit (HOSPITAL_COMMUNITY)
Admission: RE | Admit: 2018-12-11 | Discharge: 2018-12-11 | Disposition: A | Discharge status: Home / Self Care | Payer: 59 | Source: Ambulatory Visit | Attending: Internal Medicine | Admitting: Internal Medicine

## 2018-12-11 DIAGNOSIS — Z01812 Encounter for preprocedural laboratory examination: Secondary | ICD-10-CM | POA: Insufficient documentation

## 2018-12-11 DIAGNOSIS — Z20828 Contact with and (suspected) exposure to other viral communicable diseases: Secondary | ICD-10-CM | POA: Diagnosis not present

## 2018-12-11 LAB — SARS CORONAVIRUS 2 (TAT 6-24 HRS): SARS Coronavirus 2: NEGATIVE

## 2018-12-15 ENCOUNTER — Ambulatory Visit (HOSPITAL_BASED_OUTPATIENT_CLINIC_OR_DEPARTMENT_OTHER): Payer: 59 | Attending: Internal Medicine | Admitting: Radiology

## 2018-12-15 ENCOUNTER — Other Ambulatory Visit (HOSPITAL_BASED_OUTPATIENT_CLINIC_OR_DEPARTMENT_OTHER): Payer: 59 | Admitting: Internal Medicine

## 2018-12-15 DIAGNOSIS — G4733 Obstructive sleep apnea (adult) (pediatric): Secondary | ICD-10-CM

## 2018-12-16 ENCOUNTER — Other Ambulatory Visit: Payer: Self-pay
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
## 2018-12-29 NOTE — Patient Instructions (Addendum)
Tests ordered today. Your results will be released to [facility] (or called to you) after review.  If any changes need to be made, you will be notified at that same time.  All other Health Maintenance issues reviewed.   All recommended immunizations and age-appropriate screenings are up-to-date or discussed.  No immunization administered today.   Medications reviewed and updated.  Changes include :   none  Your prescription(s) have been submitted to your pharmacy. Please take as directed and contact our office if you believe you are having problem(s) with the medication(s).  A referral was ordered for GI for a colonoscopy  Please followup in 1 year    Health Maintenance, Male Adopting a healthy lifestyle and getting preventive care are important in promoting health and wellness. Ask your health care provider about:  The right schedule for you to have regular tests and exams.  Things you can do on your own to prevent diseases and keep yourself healthy. What should I know about diet, weight, and exercise? Eat a healthy diet   Eat a diet that includes plenty of vegetables, fruits, low-fat dairy products, and lean protein.  Do not eat a lot of foods that are high in solid fats, added sugars, or sodium. Maintain a healthy weight Body mass index (BMI) is a measurement that can be used to identify possible weight problems. It estimates body fat based on height and weight. Your health care provider can help determine your BMI and help you achieve or maintain a healthy weight. Get regular exercise Get regular exercise. This is one of the most important things you can do for your health. Most adults should:  Exercise for at least 150 minutes each week. The exercise should increase your heart rate and make you sweat (moderate-intensity exercise).  Do strengthening exercises at least twice a week. This is in addition to the moderate-intensity exercise.  Spend less time sitting. Even light  physical activity can be beneficial. Watch cholesterol and blood lipids Have your blood tested for lipids and cholesterol at 50 years of age, then have this test every 5 years. You may need to have your cholesterol levels checked more often if:  Your lipid or cholesterol levels are high.  You are older than 50 years of age.  You are at high risk for heart disease. What should I know about cancer screening? Many types of cancers can be detected early and may often be prevented. Depending on your health history and family history, you may need to have cancer screening at various ages. This may include screening for:  Colorectal cancer.  Prostate cancer.  Skin cancer.  Lung cancer. What should I know about heart disease, diabetes, and high blood pressure? Blood pressure and heart disease  High blood pressure causes heart disease and increases the risk of stroke. This is more likely to develop in people who have high blood pressure readings, are of African descent, or are overweight.  Talk with your health care provider about your target blood pressure readings.  Have your blood pressure checked: ? Every 3-5 years if you are 21-69 years of age. ? Every year if you are 14 years old or older.  If you are between the ages of 46 and 50 and are a current or former smoker, ask your health care provider if you should have a one-time screening for abdominal aortic aneurysm (AAA). Diabetes Have regular diabetes screenings. This checks your fasting blood sugar level. Have the screening done:  Once every  three years after age 56 if you are at a normal weight and have a low risk for diabetes.  More often and at a younger age if you are overweight or have a high risk for diabetes. What should I know about preventing infection? Hepatitis B If you have a higher risk for hepatitis B, you should be screened for this virus. Talk with your health care provider to find out if you are at risk for  hepatitis B infection. Hepatitis C Blood testing is recommended for:  Everyone born from [birth year] through 1967.  Anyone with known risk factors for hepatitis C. Sexually transmitted infections (STIs)  You should be screened each year for STIs, including gonorrhea and chlamydia, if: ? You are sexually active and are younger than 50 years of age. ? You are older than 50 years of age and your health care provider tells you that you are at risk for this type of infection. ? Your sexual activity has changed since you were last screened, and you are at increased risk for chlamydia or gonorrhea. Ask your health care provider if you are at risk.  Ask your health care provider about whether you are at high risk for HIV. Your health care provider may recommend a prescription medicine to help prevent HIV infection. If you choose to take medicine to prevent HIV, you should first get tested for HIV. You should then be tested every 3 months for as long as you are taking the medicine. Follow these instructions at home: Lifestyle  Do not use any products that contain nicotine or tobacco, such as cigarettes, e-cigarettes, and chewing tobacco. If you need help quitting, ask your health care provider.  Do not use street drugs.  Do not share needles.  Ask your health care provider for help if you need support or information about quitting drugs. Alcohol use  Do not drink alcohol if your health care provider tells you not to drink.  If you drink alcohol: ? Limit how much you have to 0-2 drinks a day. ? Be aware of how much alcohol is in your drink. In the [place] one drink equals one 12 oz bottle of beer (355 mL), one 5 oz glass of wine (148 mL), or one 1 oz glass of hard liquor (44 mL). General instructions  Schedule regular health, dental, and eye exams.  Stay current with your vaccines.  Tell your health care provider if: ? You often feel depressed. ? You have ever been abused or do not feel safe at  home. Summary  Adopting a healthy lifestyle and getting preventive care are important in promoting health and wellness.  Follow your health care provider's instructions about healthy diet, exercising, and getting tested or screened for diseases.  Follow your health care provider's instructions on monitoring your cholesterol and blood pressure. This information is not intended to replace advice given to you by your health care provider. Make sure you discuss any questions you have with your health care provider. Document Released: 10/19/2007 Document Revised: 04/15/2018 Document Reviewed: 04/15/2018 Elsevier Patient Education  2020 [employer].

## 2018-12-30 ENCOUNTER — Encounter: Payer: Self-pay | Admitting: Internal Medicine

## 2018-12-30 ENCOUNTER — Other Ambulatory Visit: Payer: Self-pay

## 2018-12-30 ENCOUNTER — Encounter: Payer: Self-pay | Admitting: Gastroenterology

## 2018-12-30 ENCOUNTER — Ambulatory Visit (INDEPENDENT_AMBULATORY_CARE_PROVIDER_SITE_OTHER): Payer: 59 | Admitting: Internal Medicine

## 2018-12-30 ENCOUNTER — Other Ambulatory Visit (INDEPENDENT_AMBULATORY_CARE_PROVIDER_SITE_OTHER): Payer: 59

## 2018-12-30 VITALS — BP 136/90 | HR 86 | Temp 98.9°F | Resp 16 | Ht 71.0 in | Wt 248.0 lb

## 2018-12-30 DIAGNOSIS — E782 Mixed hyperlipidemia: Secondary | ICD-10-CM

## 2018-12-30 DIAGNOSIS — F419 Anxiety disorder, unspecified: Secondary | ICD-10-CM

## 2018-12-30 DIAGNOSIS — Z0000 Encounter for general adult medical examination without abnormal findings: Secondary | ICD-10-CM

## 2018-12-30 DIAGNOSIS — J4520 Mild intermittent asthma, uncomplicated: Secondary | ICD-10-CM

## 2018-12-30 DIAGNOSIS — R7303 Prediabetes: Secondary | ICD-10-CM | POA: Diagnosis not present

## 2018-12-30 DIAGNOSIS — N529 Male erectile dysfunction, unspecified: Secondary | ICD-10-CM

## 2018-12-30 DIAGNOSIS — Z1211 Encounter for screening for malignant neoplasm of colon: Secondary | ICD-10-CM

## 2018-12-30 LAB — COMPREHENSIVE METABOLIC PANEL
ALT: 26 U/L (ref 0–53)
AST: 26 U/L (ref 0–37)
Albumin: 4.6 g/dL (ref 3.5–5.2)
Alkaline Phosphatase: 51 U/L (ref 39–117)
BUN: 17 mg/dL (ref 6–23)
CO2: 29 mEq/L (ref 19–32)
Calcium: 9.5 mg/dL (ref 8.4–10.5)
Chloride: 103 mEq/L (ref 96–112)
Creatinine, Ser: 1.16 mg/dL (ref 0.40–1.50)
GFR: 66.51 mL/min (ref >60.00)
Glucose, Bld: 118 mg/dL — ABNORMAL HIGH (ref 70–99)
Potassium: 4.5 mEq/L (ref 3.5–5.1)
Sodium: 140 mEq/L (ref 135–145)
Total Bilirubin: 0.4 mg/dL (ref 0.2–1.2)
Total Protein: 6.9 g/dL (ref 6.0–8.3)

## 2018-12-30 LAB — CBC WITH DIFFERENTIAL/PLATELET
Basophils Absolute: 0 10*3/uL (ref 0.0–0.1)
Basophils Relative: 1 % (ref 0.0–3.0)
Eosinophils Absolute: 0.3 10*3/uL (ref 0.0–0.7)
Eosinophils Relative: 6 % — ABNORMAL HIGH (ref 0.0–5.0)
HCT: 45.9 % (ref 39.0–52.0)
Hemoglobin: 15.5 g/dL (ref 13.0–17.0)
Lymphocytes Relative: 29.8 % (ref 12.0–46.0)
Lymphs Abs: 1.5 10*3/uL (ref 0.7–4.0)
MCHC: 33.7 g/dL (ref 30.0–36.0)
MCV: 86.8 fl (ref 78.0–100.0)
Monocytes Absolute: 0.4 10*3/uL (ref 0.1–1.0)
Monocytes Relative: 9 % (ref 3.0–12.0)
Neutro Abs: 2.7 10*3/uL (ref 1.4–7.7)
Neutrophils Relative %: 54.2 % (ref 43.0–77.0)
Platelets: 252 10*3/uL (ref 150.0–400.0)
RBC: 5.28 Mil/uL (ref 4.22–5.81)
RDW: 13.5 % (ref 11.5–15.5)
WBC: 5 10*3/uL (ref 4.0–10.5)

## 2018-12-30 LAB — LIPID PANEL
Cholesterol: 141 mg/dL (ref 0–200)
HDL: 28 mg/dL — ABNORMAL LOW (ref >39.00)
LDL Cholesterol: 76 mg/dL (ref 0–99)
NonHDL: 112.83
Total CHOL/HDL Ratio: 5
Triglycerides: 184 mg/dL — ABNORMAL HIGH (ref 0.0–149.0)
VLDL: 36.8 mg/dL (ref 0.0–40.0)

## 2018-12-30 LAB — TSH: TSH: 1.06 u[IU]/mL (ref 0.35–4.50)

## 2018-12-30 LAB — HEMOGLOBIN A1C: Hgb A1c MFr Bld: 5.9 % (ref 4.6–6.5)

## 2018-12-30 MED ORDER — ROSUVASTATIN CALCIUM 10 MG PO TABS: 10.0000 mg | ORAL_TABLET | Freq: Every day | ORAL | 1 refills | Status: DC

## 2018-12-30 MED ORDER — ACYCLOVIR 400 MG PO TABS: 400.0000 mg | ORAL_TABLET | Freq: Three times a day (TID) | ORAL | 1 refills | Status: DC

## 2018-12-30 MED ORDER — TADALAFIL 20 MG PO TABS: ORAL_TABLET | ORAL | 5 refills | Status: DC

## 2018-12-30 MED ORDER — ALPRAZOLAM 0.5 MG PO TABS: ORAL_TABLET | ORAL | 0 refills | Status: DC

## 2018-12-30 NOTE — Assessment & Plan Note (Signed)
Fairly controlled with Cialis Continue

## 2018-12-30 NOTE — Assessment & Plan Note (Signed)
Rarely takes Xanax Continue

## 2018-12-30 NOTE — Assessment & Plan Note (Signed)
Check lipid panel,cmp ,tsh Continue daily statin Regular exercise and healthy diet encouraged  

## 2018-12-30 NOTE — Assessment & Plan Note (Signed)
Mild, intermittent Has not needed albuterol in a while Continue albuterol as needed

## 2018-12-30 NOTE — Assessment & Plan Note (Signed)
Check a1c Low sugar / carb diet Stressed regular exercise   

## 2018-12-31 ENCOUNTER — Encounter: Payer: Self-pay | Admitting: Internal Medicine

## 2018-12-31 LAB — PSA, TOTAL AND FREE
PSA, % Free: 50 % (calc) (ref >25)
PSA, Free: 0.1 ng/mL
PSA, Total: 0.2 ng/mL (ref <=4.0)

## 2019-01-20 ENCOUNTER — Ambulatory Visit (AMBULATORY_SURGERY_CENTER): Payer: Self-pay | Admitting: *Deleted

## 2019-01-20 ENCOUNTER — Other Ambulatory Visit: Payer: Self-pay

## 2019-01-20 VITALS — Temp 97.3°F | Ht 71.0 in | Wt 250.0 lb

## 2019-01-20 DIAGNOSIS — Z1211 Encounter for screening for malignant neoplasm of colon: Secondary | ICD-10-CM

## 2019-01-20 MED ORDER — NA SULFATE-K SULFATE-MG SULF 17.5-3.13-1.6 GM/177ML PO SOLN: ORAL | 0 refills | Status: DC

## 2019-01-20 NOTE — Progress Notes (Signed)
Patient is here in-person for PV. Patient denies any allergies to eggs or soy. Patient denies any problems with anesthesia/sedation. Patient denies any oxygen use at home. Patient denies taking any diet/weight loss medications or blood thinners. Patient is not being treated for MRSA or C-diff. EMMI education assisgned to patient on colonoscopy, this was explained and instructions given to patient. Suprep $15 off coupon given to pt.   Pt is aware that care partner will wait in the car during procedure; if they feel like they will be too hot to wait in the car; they may wait in the lobby.  We want them to wear a mask (we do not have any that we can provide them), practice social distancing, and we will check their temperatures when they get here.  I did remind patient that their care partner needs to stay in the parking lot the entire time. Pt will wear mask into building.

## 2019-02-03 ENCOUNTER — Encounter: Payer: 59 | Admitting: Gastroenterology

## 2019-02-04 ENCOUNTER — Encounter: Payer: Self-pay | Admitting: Gastroenterology

## 2019-02-17 ENCOUNTER — Telehealth: Payer: Self-pay

## 2019-02-17 NOTE — Telephone Encounter (Signed)
Covid-19 screening questions   Do you now or have you had a fever in the last 14 days? NO   Do you have any respiratory symptoms of shortness of breath or cough now or in the last 14 days? NO  Do you have any family members or close contacts with diagnosed or suspected Covid-19 in the past 14 days? NO  Have you been tested for Covid-19 and found to be positive? NO        

## 2019-02-18 ENCOUNTER — Ambulatory Visit (AMBULATORY_SURGERY_CENTER): Payer: 59 | Admitting: Gastroenterology

## 2019-02-18 ENCOUNTER — Encounter: Payer: Self-pay | Admitting: Gastroenterology

## 2019-02-18 ENCOUNTER — Other Ambulatory Visit: Payer: Self-pay

## 2019-02-18 VITALS — BP 126/90 | HR 70 | Temp 98.5°F | Resp 15 | Ht 71.0 in | Wt 250.0 lb

## 2019-02-18 DIAGNOSIS — Z1211 Encounter for screening for malignant neoplasm of colon: Secondary | ICD-10-CM

## 2019-02-18 DIAGNOSIS — D123 Benign neoplasm of transverse colon: Secondary | ICD-10-CM | POA: Diagnosis not present

## 2019-02-18 DIAGNOSIS — D120 Benign neoplasm of cecum: Secondary | ICD-10-CM

## 2019-02-18 DIAGNOSIS — D122 Benign neoplasm of ascending colon: Secondary | ICD-10-CM | POA: Diagnosis not present

## 2019-02-18 MED ORDER — SODIUM CHLORIDE 0.9 % IV SOLN: 500.0000 mL | Freq: Once | INTRAVENOUS | Status: DC

## 2019-02-18 NOTE — Addendum Note (Signed)
Addended by: Lexine Baton E on: 02/18/2019 03:16 PM   Modules accepted: Orders

## 2019-02-18 NOTE — Progress Notes (Signed)
Pt's states no medical or surgical changes since previsi  LC - temp CW - vitals

## 2019-02-18 NOTE — Patient Instructions (Addendum)
Read all of the handouts given to you by your recovery room nurse.  A repeat  Colonoscopy should be in 1 year.  Stay off NSAIDs, aspirin, naproxen and aleve for two weeks to prevent bleeding.   YOU HAD AN ENDOSCOPIC PROCEDURE TODAY AT [facility]:   Refer to the procedure report that was given to you for any specific questions about what was found during the examination.  If the procedure report does not answer your questions, please call your gastroenterologist to clarify.  If you requested that your care partner not be given the details of your procedure findings, then the procedure report has been included in a sealed envelope for you to review at your convenience later.  YOU SHOULD EXPECT: Some feelings of bloating in the abdomen. Passage of more gas than usual.  Walking can help get rid of the air that was put into your GI tract during the procedure and reduce the bloating. If you had a lower endoscopy (such as a colonoscopy or flexible sigmoidoscopy) you may notice spotting of blood in your stool or on the toilet paper. If you underwent a bowel prep for your procedure, you may not have a normal bowel movement for a few days.  Please Note:  You might notice some irritation and congestion in your nose or some drainage.  This is from the oxygen used during your procedure.  There is no need for concern and it should clear up in a day or so.  SYMPTOMS TO REPORT IMMEDIATELY:  Look for any unusual bleeding from the rectum.  IF you can see a lot in the toilet, call us. Any unusual pain. Please report. Fever over 100.0 call us.  For urgent or emergent issues, a gastroenterologist can be reached at any hour by calling [phone].   DIET:  We do recommend a small meal at first, but then you may proceed to your regular diet.  Drink plenty of fluids but you should avoid alcoholic beverages for 24 hours. Try to increase the fiber in your diet, and drink plenty of water.  ACTIVITY:   You should plan to take it easy for the rest of today and you should NOT DRIVE or use heavy machinery until tomorrow (because of the sedation medicines used during the test).    FOLLOW UP: Our staff will call the number listed on your records 48-72 hours following your procedure to check on you and address any questions or concerns that you may have regarding the information given to you following your procedure. If we do not reach you, we will leave a message.  We will attempt to reach you two times.  During this call, we will ask if you have developed any symptoms of COVID 19. If you develop any symptoms (ie: fever, flu-like symptoms, shortness of breath, cough etc.) before then, please call [phone].  If you test positive for Covid 19 in the 2 weeks post procedure, please call and report this information to [place].    If any biopsies were taken you will be contacted by phone or by letter within the next 1-3 weeks.  Please call us at [phone] if you have not heard about the biopsies in 3 weeks.    SIGNATURES/CONFIDENTIALITY: You and/or your care partner have signed paperwork which will be entered into your electronic medical record.  These signatures attest to the fact that that the information above on your After Visit Summary has been reviewed and is understood.  Full responsibility of the confidentiality of this discharge information lies with you and/or your care-partner.

## 2019-02-18 NOTE — Op Note (Signed)
Gilbert Perkins Patient Name: Gilbert Perkins Procedure Date: 02/18/2019 8:47 AM MRN: [id number] Endoscopist: Mauri Pole , MD Age: 48 Referring MD:  Date of Birth: 02-03-1971 Gender: Male Account #: [id number] Procedure:                Colonoscopy Indications:              Screening for colorectal malignant neoplasm Medicines:                Monitored Anesthesia Care Procedure:                Pre-Anesthesia Assessment:                           - Prior to the procedure, a History and Physical                            was performed, and patient medications and                            allergies were reviewed. The patient's tolerance of                            previous anesthesia was also reviewed. The risks                            and benefits of the procedure and the sedation                            options and risks were discussed with the patient.                            All questions were answered, and informed consent                            was obtained. Prior Anticoagulants: The patient has                            taken no previous anticoagulant or antiplatelet                            agents. ASA Grade Assessment: II - A patient with                            mild systemic disease. After reviewing the risks                            and benefits, the patient was deemed in                            satisfactory condition to undergo the procedure.                           After obtaining informed consent, the colonoscope  was passed under direct vision. Throughout the                            procedure, the patient's blood pressure, pulse, and                            oxygen saturations were monitored continuously. The                            Colonoscope was introduced through the anus and                            advanced to the the cecum, identified by                            appendiceal orifice and  ileocecal valve. The                            colonoscopy was somewhat difficult due to                            inadequate bowel prep. Successful completion of the                            procedure was aided by lavage. The patient                            tolerated the procedure well. The quality of the                            bowel preparation was adequate. The ileocecal                            valve, appendiceal orifice, and rectum were                            photographed. Scope In: 8:59:31 AM Scope Out: 9:43:01 AM Scope Withdrawal Time: 0 hours 35 minutes 50 seconds  Total Procedure Duration: 0 hours 43 minutes 30 seconds  Findings:                 The perianal and digital rectal examinations were                            normal.                           Six sessile polyps were found in the transverse                            colon, ascending colon and cecum. The polyps were 1                            to 2 mm in size. These polyps were removed with a  cold biopsy forceps. Resection and retrieval were                            complete.                           Six sessile polyps were found in the transverse                            colon and ascending colon. The polyps were 4 to 6                            mm in size. These polyps were removed with a cold                            snare. Resection and retrieval were complete.                           Scattered small and large-mouthed diverticula were                            found in the sigmoid colon, descending colon,                            transverse colon, ascending colon and cecum.                           Non-bleeding internal hemorrhoids were found during                            retroflexion. The hemorrhoids were small. Complications:            No immediate complications. Estimated Blood Loss:     Estimated blood loss was minimal. Impression:                - Six 1 to 2 mm polyps in the transverse colon, in                            the ascending colon and in the cecum, removed with                            a cold biopsy forceps. Resected and retrieved.                           - Six 4 to 6 mm polyps in the transverse colon and                            in the ascending colon, removed with a cold snare.                            Resected and retrieved.                           - Diverticulosis in the sigmoid colon,  in the                            descending colon, in the transverse colon, in the                            ascending colon and in the cecum.                           - Non-bleeding internal hemorrhoids. Recommendation:           - Patient has a contact number available for                            emergencies. The signs and symptoms of potential                            delayed complications were discussed with the                            patient. Return to normal activities tomorrow.                            Written discharge instructions were provided to the                            patient.                           - Resume previous diet.                           - Continue present medications.                           - Await pathology results.                           - Repeat colonoscopy in 1 year for surveillance                            based on pathology results. Mauri Pole, MD 02/18/2019 9:52:10 AM This report has been signed electronically.

## 2019-02-18 NOTE — Progress Notes (Signed)
To PACU, VSS. Report to [email or web address] 

## 2019-02-18 NOTE — Progress Notes (Signed)
Called to room to assist during endoscopic procedure.  Patient ID and intended procedure confirmed with present staff. Received instructions for my participation in the procedure from the performing physician.  

## 2019-02-22 ENCOUNTER — Telehealth: Payer: Self-pay

## 2019-02-22 ENCOUNTER — Encounter: Payer: Self-pay | Admitting: Gastroenterology

## 2019-02-22 NOTE — Telephone Encounter (Signed)
  Follow up Call-  Call back number 02/18/2019  Post procedure Call Back phone  # [phone]  Permission to leave phone message Yes  Some recent data might be hidden     Patient questions:  Do you have a fever, pain , or abdominal swelling? No. Pain Score  0 *  Have you tolerated food without any problems? Yes.    Have you been able to return to your normal activities? Yes.    Do you have any questions about your discharge instructions: Diet   No. Medications  No. Follow up visit  No.  Do you have questions or concerns about your Care? No.  Actions: * If pain score is 4 or above: 1. No action needed, pain <4.Have you developed a fever since your procedure? no  2.   Have you had an respiratory symptoms (SOB or cough) since your procedure? no  3.   Have you tested positive for COVID 19 since your procedure no  4.   Have you had any family members/close contacts diagnosed with the COVID 19 since your procedure?  no   If yes to any of these questions please route to Joylene John, RN and Alphonsa Gin, Therapist, sports.

## 2019-02-25 ENCOUNTER — Other Ambulatory Visit: Payer: Self-pay | Admitting: Internal Medicine

## 2019-02-25 NOTE — Telephone Encounter (Signed)
Last OV 12/30/18 Next OV NA Last RF 12/30/18

## 2019-05-05 ENCOUNTER — Other Ambulatory Visit: Payer: Self-pay

## 2019-05-05 ENCOUNTER — Ambulatory Visit: Payer: 59 | Admitting: Podiatry

## 2019-05-05 ENCOUNTER — Ambulatory Visit (INDEPENDENT_AMBULATORY_CARE_PROVIDER_SITE_OTHER): Payer: 59

## 2019-05-05 ENCOUNTER — Encounter: Payer: Self-pay | Admitting: Podiatry

## 2019-05-05 VITALS — BP 141/95 | HR 104 | Temp 97.6°F

## 2019-05-05 DIAGNOSIS — M79672 Pain in left foot: Secondary | ICD-10-CM | POA: Diagnosis not present

## 2019-05-05 DIAGNOSIS — M779 Enthesopathy, unspecified: Secondary | ICD-10-CM

## 2019-05-05 DIAGNOSIS — M722 Plantar fascial fibromatosis: Secondary | ICD-10-CM | POA: Diagnosis not present

## 2019-05-05 DIAGNOSIS — M79671 Pain in right foot: Secondary | ICD-10-CM

## 2019-05-05 MED ORDER — DICLOFENAC SODIUM 75 MG PO TBEC: 75.0000 mg | DELAYED_RELEASE_TABLET | Freq: Two times a day (BID) | ORAL | 2 refills | Status: DC

## 2019-05-05 NOTE — Patient Instructions (Signed)
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
## 2019-05-19 ENCOUNTER — Other Ambulatory Visit: Payer: Self-pay

## 2019-05-19 ENCOUNTER — Encounter: Payer: Self-pay | Admitting: Podiatry

## 2019-05-19 ENCOUNTER — Ambulatory Visit: Payer: 59 | Admitting: Podiatry

## 2019-05-19 DIAGNOSIS — M216X9 Other acquired deformities of unspecified foot: Secondary | ICD-10-CM

## 2019-05-19 DIAGNOSIS — M722 Plantar fascial fibromatosis: Secondary | ICD-10-CM | POA: Diagnosis not present

## 2019-05-19 NOTE — Progress Notes (Signed)
Subjective:   Patient ID: Gilbert Perkins, male   DOB: 49 y.o.   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  Also has significant equinus     Plan:  H&P reviewed conditions and discussed surgical and nonsurgical options.  I do think most likely this will take surgical intervention but we are going to try aggressive conservative care again and I did do sterile prep injected the fascia bilateral 3 mg Kenalog 5 mg Xylocaine and I went ahead and I applied night splint to the left to see whether or not this could give him resolution of symptoms and we did discuss he would probably need to be off of work approximately 12 weeks as far as his normal job light duty for short period of time reappoint 2 weeks or earlier if needed and did discuss gastroc lengthening along with endoscopic heel release

## 2019-05-29 ENCOUNTER — Other Ambulatory Visit: Payer: Self-pay | Admitting: Internal Medicine

## 2019-05-29 DIAGNOSIS — N529 Male erectile dysfunction, unspecified: Secondary | ICD-10-CM

## 2019-06-02 ENCOUNTER — Ambulatory Visit: Payer: 59 | Admitting: Podiatry

## 2019-06-04 ENCOUNTER — Other Ambulatory Visit: Payer: Self-pay

## 2019-06-04 ENCOUNTER — Ambulatory Visit: Payer: 59 | Admitting: Podiatry

## 2019-06-04 ENCOUNTER — Encounter: Payer: Self-pay | Admitting: Podiatry

## 2019-06-04 VITALS — Temp 97.9°F

## 2019-06-04 DIAGNOSIS — M722 Plantar fascial fibromatosis: Secondary | ICD-10-CM | POA: Diagnosis not present

## 2019-06-04 MED ORDER — TRAMADOL HCL 50 MG PO TABS: 50.0000 mg | ORAL_TABLET | Freq: Three times a day (TID) | ORAL | 2 refills | Status: DC

## 2019-06-04 NOTE — Progress Notes (Signed)
Subjective:   Patient ID: Gilbert Perkins, male   DOB: 49 y.o.   MRN: [id number]   HPI Patient states his heels are still killing him and making it difficult for him to be active and states that he simply cannot do surgery at this time due to his schedule   ROS      Objective:  Physical Exam  Neurovascular status intact with chronic discomfort in the mid plantar fascial band bilateral with pain worse right over left stating that it is been ongoing and increasingly make it hard for him to be active.  States that he knows he needs surgery but he cannot do it now and he also has equinus     Assessment:  Acute plantar fasciitis bilateral right over left with inflammation and equinus condition     Plan:  H&P reviewed condition and at this time we will get a try orthotics to offload weight off the heel.  He will continue night splints I discussed possible casting and ultimately would like to be able to do endoscopic and gastroc lengthening surgery but he simply cannot do it at this current time

## 2019-07-01 ENCOUNTER — Other Ambulatory Visit: Payer: Self-pay | Admitting: Internal Medicine

## 2019-07-20 ENCOUNTER — Other Ambulatory Visit: Payer: Self-pay

## 2019-07-20 ENCOUNTER — Ambulatory Visit: Payer: 59 | Admitting: Orthotics

## 2019-07-20 DIAGNOSIS — M216X9 Other acquired deformities of unspecified foot: Secondary | ICD-10-CM

## 2019-07-20 DIAGNOSIS — M79671 Pain in right foot: Secondary | ICD-10-CM

## 2019-07-20 DIAGNOSIS — M79672 Pain in left foot: Secondary | ICD-10-CM

## 2019-07-20 DIAGNOSIS — M722 Plantar fascial fibromatosis: Secondary | ICD-10-CM

## 2019-07-20 NOTE — Progress Notes (Signed)
Patient came in today to pick up custom made foot orthotics.  The goals were accomplished and the patient reported no dissatisfaction with said orthotics.  Patient was advised of breakin period and how to report any issues. 

## 2019-07-22 ENCOUNTER — Other Ambulatory Visit: Payer: Self-pay | Admitting: Podiatry

## 2019-07-31 ENCOUNTER — Other Ambulatory Visit: Payer: Self-pay | Admitting: Internal Medicine

## 2019-08-02 NOTE — Telephone Encounter (Signed)
Last RF 02/25/20 Last OV 12/30/18 Next OV NA

## 2019-08-24 ENCOUNTER — Other Ambulatory Visit: Payer: Self-pay

## 2019-08-24 ENCOUNTER — Ambulatory Visit: Payer: 59 | Admitting: Orthotics

## 2019-08-24 DIAGNOSIS — M216X9 Other acquired deformities of unspecified foot: Secondary | ICD-10-CM

## 2019-08-24 DIAGNOSIS — M79672 Pain in left foot: Secondary | ICD-10-CM

## 2019-08-24 DIAGNOSIS — M722 Plantar fascial fibromatosis: Secondary | ICD-10-CM

## 2019-08-24 DIAGNOSIS — M79671 Pain in right foot: Secondary | ICD-10-CM

## 2019-08-24 NOTE — Progress Notes (Signed)
Sending f/o back to make adjustments:  Wider, 1/8 cushion, h/s RF cushion, small met pad, heel punch

## 2019-10-21 ENCOUNTER — Other Ambulatory Visit: Payer: Self-pay

## 2019-10-21 MED ORDER — ACYCLOVIR 400 MG PO TABS: 400.0000 mg | ORAL_TABLET | Freq: Three times a day (TID) | ORAL | 1 refills | Status: DC

## 2019-11-24 ENCOUNTER — Encounter: Payer: Self-pay | Admitting: Internal Medicine

## 2019-11-26 ENCOUNTER — Encounter: Payer: Self-pay | Admitting: Internal Medicine

## 2019-11-26 ENCOUNTER — Other Ambulatory Visit: Payer: Self-pay

## 2019-11-26 ENCOUNTER — Ambulatory Visit: Payer: 59 | Admitting: Internal Medicine

## 2019-11-26 DIAGNOSIS — J310 Chronic rhinitis: Secondary | ICD-10-CM

## 2019-11-26 DIAGNOSIS — G4733 Obstructive sleep apnea (adult) (pediatric): Secondary | ICD-10-CM

## 2019-11-26 NOTE — Patient Instructions (Signed)
We can continue CPAP auto 5-12  Please call if we can help  I do strongly recommend the covid vaccine- happy to discuss it more if you have questions.

## 2019-11-26 NOTE — Progress Notes (Signed)
HPI male former smoker followed for OSA complicated by asthma, obesity HST 04/13/17-AHI 13.1/hour, desaturation to 85%, body weight 249 pounds  -----------------------------------------------------------------------------   11/26/2018- 49 year old male former smoker followed for OSA complicated by asthma, obesity, Rhinitis medicamentosa CPAP auto 5-12/[employer] Download compliance 57%, AHI 2.7/ hr Wife on speaker phone during visit Body weight today 246 lbs Albuterol hfa, Xanax 0.5 mg at hs Uses nasal pillows due to facial hair. Wife reports he snores and stops breathing- certainly when he takes mask off in his sleep. Nasal congestion interferes still. She would like him to try a full face mask, if it will seal. I suggested chin strap. ENT told him to stop using decongestant nasal sprays, as expected. He continues.  11/26/19-  49 year old male former smoker followed for OSA complicated by asthma, obesity, Rhinitis medicamentosa CPAP auto 5-12/[employer]/ [employer] Download compliance 70%, AHI 1.7/ hr Body weight 248 lbs -----33yr f/u for OSA. States he has been doing well with his CPAP machine.  Has not had Covax- discussed.  ROS-see HPI   + = positive Constitutional:    weight loss, night sweats, fevers, chills, fatigue, lassitude. HEENT:    +headaches, +difficulty swallowing, tooth/dental problems, sore throat,       sneezing, itching, ear ache, +nasal congestion, post nasal drip, +snoring CV:    chest pain, orthopnea, PND, swelling in lower extremities, anasarca,                                                     dizziness, palpitations Resp:   shortness of breath with exertion or at rest.                productive cough,   non-productive cough, coughing up of blood.              change in color of mucus.  wheezing.   Skin:    rash or lesions. GI:  No-   heartburn, indigestion, abdominal pain, nausea, vomiting, diarrhea,                 change in bowel habits, loss of appetite GU:  dysuria, change in color of urine, no urgency or frequency.   flank pain. MS:   joint pain, stiffness, decreased range of motion, back pain. Neuro-     nothing unusual Psych:  change in mood or affect.  depression or anxiety.   memory loss.  OBJ- Physical Exam General- Alert, Oriented, Affect-appropriate, Distress- none acute,+ muscular Skin- rash-none, lesions- none, excoriation- none Lymphadenopathy- none Head- atraumatic            Eyes- Gross vision intact, PERRLA, conjunctivae and secretions clear            Ears- Hearing, canals-normal            Nose-+ turbinate edema, + mild septal dev,  polyps, erosion, perforation             Throat- Mallampati III-IV , mucosa clear , drainage- none, tonsils- atrophic Neck- flexible , trachea midline, no stridor , thyroid nl, carotid no bruit Chest - symmetrical excursion , unlabored           Heart/CV- RRR , no murmur , no gallop  , no rub, nl s1 s2                           -  JVD- none , edema- none, stasis changes- none, varices- none           Lung- clear to P&A, wheeze- none, cough- none , dullness-none, rub- none           Chest wall-  Abd-  Br/ Gen/ Rectal- Not done, not indicated Extrem- cyanosis- none, clubbing, none, atrophy- none, strength- nl Neuro- grossly intact to observation

## 2019-11-28 NOTE — Assessment & Plan Note (Signed)
Benefits from CPAP. Reviewed download, discussing compliance goals and comfort. Plan- continue auto 5-12

## 2019-11-28 NOTE — Assessment & Plan Note (Signed)
Known septal deviation, but previously educated on limiting vasoconstrictor nasal spray and not now complaining of nasal congestion or drainage.

## 2019-12-09 ENCOUNTER — Other Ambulatory Visit: Payer: Self-pay | Admitting: Internal Medicine

## 2019-12-10 NOTE — Telephone Encounter (Signed)
Per database rx last filled 08/02/2019.   Last visit on 12/30/2018

## 2020-02-07 ENCOUNTER — Encounter: Payer: 59 | Admitting: Internal Medicine

## 2020-03-01 NOTE — Patient Instructions (Addendum)
[facility] - Dr Silverio Decamp [place] Phone:  [phone]   Blood work was ordered.    All other Health Maintenance issues reviewed.   All recommended immunizations and age-appropriate screenings are up-to-date or discussed.  No immunization administered today.   Medications reviewed and updated.  Changes include :   Doxycycline and steroid for your infection.  Your prescription(s) have been submitted to your pharmacy. Please take as directed and contact our office if you believe you are having problem(s) with the medication(s).    Please followup in 1year    Health Maintenance, Male Adopting a healthy lifestyle and getting preventive care are important in promoting health and wellness. Ask your health care provider about:  The right schedule for you to have regular tests and exams.  Things you can do on your own to prevent diseases and keep yourself healthy. What should I know about diet, weight, and exercise? Eat a healthy diet   Eat a diet that includes plenty of vegetables, fruits, low-fat dairy products, and lean protein.  Do not eat a lot of foods that are high in solid fats, added sugars, or sodium. Maintain a healthy weight Body mass index (BMI) is a measurement that can be used to identify possible weight problems. It estimates body fat based on height and weight. Your health care provider can help determine your BMI and help you achieve or maintain a healthy weight. Get regular exercise Get regular exercise. This is one of the most important things you can do for your health. Most adults should:  Exercise for at least 150 minutes each week. The exercise should increase your heart rate and make you sweat (moderate-intensity exercise).  Do strengthening exercises at least twice a week. This is in addition to the moderate-intensity exercise.  Spend less time sitting. Even light physical activity can be beneficial. Watch cholesterol and  blood lipids Have your blood tested for lipids and cholesterol at 51 years of age, then have this test every 5 years. You may need to have your cholesterol levels checked more often if:  Your lipid or cholesterol levels are high.  You are older than 51 years of age.  You are at high risk for heart disease. What should I know about cancer screening? Many types of cancers can be detected early and may often be prevented. Depending on your health history and family history, you may need to have cancer screening at various ages. This may include screening for:  Colorectal cancer.  Prostate cancer.  Skin cancer.  Lung cancer. What should I know about heart disease, diabetes, and high blood pressure? Blood pressure and heart disease  High blood pressure causes heart disease and increases the risk of stroke. This is more likely to develop in people who have high blood pressure readings, are of African descent, or are overweight.  Talk with your health care provider about your target blood pressure readings.  Have your blood pressure checked: ? Every 3-5 years if you are 21-80 years of age. ? Every year if you are 29 years old or older.  If you are between the ages of 64 and 31 and are a current or former smoker, ask your health care provider if you should have a one-time screening for abdominal aortic aneurysm (AAA). Diabetes Have regular diabetes screenings. This checks your fasting blood sugar level. Have the screening done:  Once every three years after age 27 if you are at a normal weight and have  a low risk for diabetes.  More often and at a younger age if you are overweight or have a high risk for diabetes. What should I know about preventing infection? Hepatitis B If you have a higher risk for hepatitis B, you should be screened for this virus. Talk with your health care provider to find out if you are at risk for hepatitis B infection. Hepatitis C Blood testing is  recommended for:  Everyone born from [birth year] through 1967.  Anyone with known risk factors for hepatitis C. Sexually transmitted infections (STIs)  You should be screened each year for STIs, including gonorrhea and chlamydia, if: ? You are sexually active and are younger than 51 years of age. ? You are older than 51 years of age and your health care provider tells you that you are at risk for this type of infection. ? Your sexual activity has changed since you were last screened, and you are at increased risk for chlamydia or gonorrhea. Ask your health care provider if you are at risk.  Ask your health care provider about whether you are at high risk for HIV. Your health care provider may recommend a prescription medicine to help prevent HIV infection. If you choose to take medicine to prevent HIV, you should first get tested for HIV. You should then be tested every 3 months for as long as you are taking the medicine. Follow these instructions at home: Lifestyle  Do not use any products that contain nicotine or tobacco, such as cigarettes, e-cigarettes, and chewing tobacco. If you need help quitting, ask your health care provider.  Do not use street drugs.  Do not share needles.  Ask your health care provider for help if you need support or information about quitting drugs. Alcohol use  Do not drink alcohol if your health care provider tells you not to drink.  If you drink alcohol: ? Limit how much you have to 0-2 drinks a day. ? Be aware of how much alcohol is in your drink. In the [place] one drink equals one 12 oz bottle of beer (355 mL), one 5 oz glass of wine (148 mL), or one 1 oz glass of hard liquor (44 mL). General instructions  Schedule regular health, dental, and eye exams.  Stay current with your vaccines.  Tell your health care provider if: ? You often feel depressed. ? You have ever been abused or do not feel safe at home. Summary  Adopting a healthy lifestyle and  getting preventive care are important in promoting health and wellness.  Follow your health care provider's instructions about healthy diet, exercising, and getting tested or screened for diseases.  Follow your health care provider's instructions on monitoring your cholesterol and blood pressure. This information is not intended to replace advice given to you by your health care provider. Make sure you discuss any questions you have with your health care provider. Document Revised: 04/15/2018 Document Reviewed: 04/15/2018 Elsevier Patient Education  2020 [employer].
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
## 2020-03-02 ENCOUNTER — Other Ambulatory Visit: Payer: Self-pay

## 2020-03-02 ENCOUNTER — Encounter: Payer: Self-pay | Admitting: Internal Medicine

## 2020-03-02 ENCOUNTER — Ambulatory Visit (INDEPENDENT_AMBULATORY_CARE_PROVIDER_SITE_OTHER): Payer: 59 | Admitting: Internal Medicine

## 2020-03-02 VITALS — BP 130/78 | HR 79 | Temp 98.2°F | Ht 71.0 in | Wt 242.0 lb

## 2020-03-02 DIAGNOSIS — N529 Male erectile dysfunction, unspecified: Secondary | ICD-10-CM

## 2020-03-02 DIAGNOSIS — E782 Mixed hyperlipidemia: Secondary | ICD-10-CM

## 2020-03-02 DIAGNOSIS — F419 Anxiety disorder, unspecified: Secondary | ICD-10-CM | POA: Diagnosis not present

## 2020-03-02 DIAGNOSIS — R7303 Prediabetes: Secondary | ICD-10-CM | POA: Diagnosis not present

## 2020-03-02 DIAGNOSIS — Z1159 Encounter for screening for other viral diseases: Secondary | ICD-10-CM

## 2020-03-02 DIAGNOSIS — Z0000 Encounter for general adult medical examination without abnormal findings: Secondary | ICD-10-CM | POA: Diagnosis not present

## 2020-03-02 DIAGNOSIS — J4520 Mild intermittent asthma, uncomplicated: Secondary | ICD-10-CM

## 2020-03-02 LAB — CBC WITH DIFFERENTIAL/PLATELET
Basophils Absolute: 0.1 10*3/uL (ref 0.0–0.1)
Basophils Relative: 0.9 % (ref 0.0–3.0)
Eosinophils Absolute: 0.5 10*3/uL (ref 0.0–0.7)
Eosinophils Relative: 7.4 % — ABNORMAL HIGH (ref 0.0–5.0)
HCT: 46.2 % (ref 39.0–52.0)
Hemoglobin: 15.8 g/dL (ref 13.0–17.0)
Lymphocytes Relative: 22.2 % (ref 12.0–46.0)
Lymphs Abs: 1.6 10*3/uL (ref 0.7–4.0)
MCHC: 34.2 g/dL (ref 30.0–36.0)
MCV: 85.8 fl (ref 78.0–100.0)
Monocytes Absolute: 0.6 10*3/uL (ref 0.1–1.0)
Monocytes Relative: 8.8 % (ref 3.0–12.0)
Neutro Abs: 4.4 10*3/uL (ref 1.4–7.7)
Neutrophils Relative %: 60.7 % (ref 43.0–77.0)
Platelets: 313 10*3/uL (ref 150.0–400.0)
RBC: 5.38 Mil/uL (ref 4.22–5.81)
RDW: 13.7 % (ref 11.5–15.5)
WBC: 7.2 10*3/uL (ref 4.0–10.5)

## 2020-03-02 LAB — LIPID PANEL
Cholesterol: 142 mg/dL (ref 0–200)
HDL: 34.9 mg/dL — ABNORMAL LOW (ref >39.00)
LDL Cholesterol: 89 mg/dL (ref 0–99)
NonHDL: 106.76
Total CHOL/HDL Ratio: 4
Triglycerides: 89 mg/dL (ref 0.0–149.0)
VLDL: 17.8 mg/dL (ref 0.0–40.0)

## 2020-03-02 LAB — COMPREHENSIVE METABOLIC PANEL
ALT: 35 U/L (ref 0–53)
AST: 28 U/L (ref 0–37)
Albumin: 4.4 g/dL (ref 3.5–5.2)
Alkaline Phosphatase: 51 U/L (ref 39–117)
BUN: 12 mg/dL (ref 6–23)
CO2: 30 mEq/L (ref 19–32)
Calcium: 9.4 mg/dL (ref 8.4–10.5)
Chloride: 103 mEq/L (ref 96–112)
Creatinine, Ser: 1.09 mg/dL (ref 0.40–1.50)
GFR: 78.49 mL/min (ref >60.00)
Glucose, Bld: 103 mg/dL — ABNORMAL HIGH (ref 70–99)
Potassium: 5.3 mEq/L — ABNORMAL HIGH (ref 3.5–5.1)
Sodium: 138 mEq/L (ref 135–145)
Total Bilirubin: 0.4 mg/dL (ref 0.2–1.2)
Total Protein: 6.7 g/dL (ref 6.0–8.3)

## 2020-03-02 LAB — TSH: TSH: 0.85 u[IU]/mL (ref 0.35–4.50)

## 2020-03-02 LAB — HEMOGLOBIN A1C: Hgb A1c MFr Bld: 6.3 % (ref 4.6–6.5)

## 2020-03-02 MED ORDER — METHYLPREDNISOLONE 4 MG PO TBPK
ORAL_TABLET | ORAL | 0 refills | Status: DC
Start: 2020-03-02 — End: 2020-10-06

## 2020-03-02 MED ORDER — SILDENAFIL CITRATE 20 MG PO TABS: ORAL_TABLET | ORAL | 2 refills | Status: DC

## 2020-03-02 MED ORDER — ROSUVASTATIN CALCIUM 10 MG PO TABS
10.0000 mg | ORAL_TABLET | Freq: Every day | ORAL | 3 refills | Status: DC
Start: 2020-03-02 — End: 2021-03-12

## 2020-03-02 MED ORDER — VARDENAFIL HCL 20 MG PO TABS
10.0000 mg | ORAL_TABLET | Freq: Every day | ORAL | 5 refills | Status: DC | PRN
Start: 2020-03-02 — End: 2020-04-18

## 2020-03-02 MED ORDER — DOXYCYCLINE HYCLATE 100 MG PO TABS: 100.0000 mg | ORAL_TABLET | Freq: Two times a day (BID) | ORAL | 0 refills | Status: DC

## 2020-03-02 NOTE — Assessment & Plan Note (Signed)
Chronic Mild, intermittent Uses rescue inhaler very infrequently Continue

## 2020-03-02 NOTE — Assessment & Plan Note (Signed)
Chronic Controlled, stable Continue xanax 0.5 mg daily prn 

## 2020-03-02 NOTE — Assessment & Plan Note (Signed)
Chronic Check lipid panel  Continue crestor 10 mg daily Regular exercise and healthy diet encouraged  

## 2020-03-02 NOTE — Assessment & Plan Note (Signed)
Chronic Using cialis prn - it works ok  Discussed options Will try levitra to see if that works better Will also try sildenafil again

## 2020-03-02 NOTE — Assessment & Plan Note (Signed)
Chronic Check a1c Low sugar / carb diet Stressed regular exercise  

## 2020-03-03 LAB — HEPATITIS C ANTIBODY
Hepatitis C Ab: NONREACTIVE
SIGNAL TO CUT-OFF: 0.01 (ref <1.00)

## 2020-03-03 LAB — PSA, TOTAL AND FREE
PSA, % Free: 33 % (calc) (ref >25)
PSA, Free: 0.1 ng/mL
PSA, Total: 0.3 ng/mL (ref <=4.0)

## 2020-04-15 ENCOUNTER — Other Ambulatory Visit: Payer: Self-pay | Admitting: Internal Medicine

## 2020-04-18 ENCOUNTER — Encounter: Payer: Self-pay | Admitting: Internal Medicine

## 2020-04-18 DIAGNOSIS — N529 Male erectile dysfunction, unspecified: Secondary | ICD-10-CM

## 2020-04-18 MED ORDER — TADALAFIL 20 MG PO TABS
ORAL_TABLET | ORAL | 10 refills | Status: DC
Start: 2020-04-18 — End: 2020-05-15

## 2020-05-13 ENCOUNTER — Other Ambulatory Visit: Payer: Self-pay | Admitting: Internal Medicine

## 2020-05-13 DIAGNOSIS — N529 Male erectile dysfunction, unspecified: Secondary | ICD-10-CM

## 2020-06-18 ENCOUNTER — Encounter: Payer: Self-pay | Admitting: Gastroenterology

## 2020-07-17 ENCOUNTER — Encounter: Payer: Self-pay | Admitting: Gastroenterology

## 2020-07-26 ENCOUNTER — Other Ambulatory Visit: Payer: Self-pay | Admitting: Internal Medicine

## 2020-08-24 ENCOUNTER — Other Ambulatory Visit: Payer: Self-pay | Admitting: Internal Medicine

## 2020-09-22 ENCOUNTER — Other Ambulatory Visit: Payer: Self-pay

## 2020-09-22 ENCOUNTER — Ambulatory Visit (AMBULATORY_SURGERY_CENTER): Payer: Self-pay

## 2020-09-22 VITALS — Ht 71.0 in | Wt 247.0 lb

## 2020-09-22 DIAGNOSIS — Z86010 Personal history of colonic polyps: Secondary | ICD-10-CM

## 2020-09-22 MED ORDER — NA SULFATE-K SULFATE-MG SULF 17.5-3.13-1.6 GM/177ML PO SOLN: 1.0000 | Freq: Once | ORAL | 0 refills | Status: AC

## 2020-09-22 NOTE — Progress Notes (Signed)
No allergies to soy or egg Pt is not on blood thinners or diet pills Denies issues with sedation/intubation Denies atrial flutter/fib Denies constipation   Pt is aware of Covid safety and care partner requirements.      

## 2020-09-26 ENCOUNTER — Other Ambulatory Visit: Payer: Self-pay | Admitting: Internal Medicine

## 2020-09-29 ENCOUNTER — Encounter: Payer: Self-pay | Admitting: Gastroenterology

## 2020-10-06 ENCOUNTER — Other Ambulatory Visit: Payer: Self-pay

## 2020-10-06 ENCOUNTER — Ambulatory Visit (AMBULATORY_SURGERY_CENTER): Payer: 59 | Admitting: Gastroenterology

## 2020-10-06 ENCOUNTER — Encounter: Payer: Self-pay | Admitting: Gastroenterology

## 2020-10-06 VITALS — BP 121/89 | HR 82 | Temp 97.1°F | Resp 16 | Ht 71.0 in | Wt 247.0 lb

## 2020-10-06 DIAGNOSIS — D128 Benign neoplasm of rectum: Secondary | ICD-10-CM

## 2020-10-06 DIAGNOSIS — Z86010 Personal history of colonic polyps: Secondary | ICD-10-CM

## 2020-10-06 DIAGNOSIS — D129 Benign neoplasm of anus and anal canal: Secondary | ICD-10-CM

## 2020-10-06 DIAGNOSIS — D123 Benign neoplasm of transverse colon: Secondary | ICD-10-CM

## 2020-10-06 DIAGNOSIS — K621 Rectal polyp: Secondary | ICD-10-CM

## 2020-10-06 MED ORDER — SODIUM CHLORIDE 0.9 % IV SOLN
500.0000 mL | Freq: Once | INTRAVENOUS | Status: DC
Start: 2020-10-06 — End: 2021-10-02

## 2020-10-06 NOTE — Patient Instructions (Signed)
? ? ?HANDOUTS ON POLYPS ,DIVERTICULOSIS,& HEMORRHOIDS GIVEN TO YOU TODAY  ? ?AWAIT PATHOLOGY RESULTS ON POLYPS REMOVED  ? ? ?YOU HAD AN ENDOSCOPIC PROCEDURE TODAY AT THE [facility]:   Refer to the procedure report that was given to you for any specific questions about what was found during the examination.  If the procedure report does not answer your questions, please call your gastroenterologist to clarify.  If you requested that your care partner not be given the details of your procedure findings, then the procedure report has been included in a sealed envelope for you to review at your convenience later. ? ?YOU SHOULD EXPECT: Some feelings of bloating in the abdomen. Passage of more gas than usual.  Walking can help get rid of the air that was put into your GI tract during the procedure and reduce the bloating. If you had a lower endoscopy (such as a colonoscopy or flexible sigmoidoscopy) you may notice spotting of blood in your stool or on the toilet paper. If you underwent a bowel prep for your procedure, you may not have a normal bowel movement for a few days. ? ?Please Note:  You might notice some irritation and congestion in your nose or some drainage.  This is from the oxygen used during your procedure.  There is no need for concern and it should clear up in a day or so. ? ?SYMPTOMS TO REPORT IMMEDIATELY: ? ?Following lower endoscopy (colonoscopy or flexible sigmoidoscopy): ? Excessive amounts of blood in the stool ? Significant tenderness or worsening of abdominal pains ? Swelling of the abdomen that is new, acute ? Fever of 100?F or higher ? ? ?For urgent or emergent issues, a gastroenterologist can be reached at any hour by calling [phone]. ?Do not use MyChart messaging for urgent concerns.  ? ? ?DIET:  We do recommend a small meal at first, but then you may proceed to your regular diet.  Drink plenty of fluids but you should avoid alcoholic beverages for 24 hours. ? ?ACTIVITY:   You should plan to take it easy for the rest of today and you should NOT DRIVE or use heavy machinery until tomorrow (because of the sedation medicines used during the test).   ? ?FOLLOW UP: ?Our staff will call the number listed on your records 48-72 hours following your procedure to check on you and address any questions or concerns that you may have regarding the information given to you following your procedure. If we do not reach you, we will leave a message.  We will attempt to reach you two times.  During this call, we will ask if you have developed any symptoms of COVID 19. If you develop any symptoms (ie: fever, flu-like symptoms, shortness of breath, cough etc.) before then, please call [phone].  If you test positive for Covid 19 in the 2 weeks post procedure, please call and report this information to us.   ? ?If any biopsies were taken you will be contacted by phone or by letter within the next 1-3 weeks.  Please call us at [phone] if you have not heard about the biopsies in 3 weeks.  ? ? ?SIGNATURES/CONFIDENTIALITY: ?You and/or your care partner have signed paperwork which will be entered into your electronic medical record.  These signatures attest to the fact that that the information above on your After Visit Summary has been reviewed and is understood.  Full responsibility of the confidentiality of this discharge information lies with you   and/or your care-partner.  ? ? ? ?

## 2020-10-06 NOTE — Progress Notes (Signed)
Vs by cw.  previsit with TM.  No changes to health hx since previsit

## 2020-10-06 NOTE — Progress Notes (Signed)
pt tolerated well. VSS. awake and to recovery. Report given to RN.  

## 2020-10-06 NOTE — Op Note (Signed)
[place] Patient Name: Gilbert Perkins Procedure Date: 10/06/2020 11:21 AM MRN: [id number] Endoscopist: Mauri Pole , MD Age: 50 Referring MD:  Date of Birth: 12-24-1970 Gender: Male Account #: [id number] Procedure:                Colonoscopy Indications:              High risk colon cancer surveillance: Personal                            history of colonic polyps, High risk colon cancer                            surveillance: Personal history of multiple (3 or                            more) adenomas Medicines:                Monitored Anesthesia Care Procedure:                Pre-Anesthesia Assessment:                           - Prior to the procedure, a History and Physical                            was performed, and patient medications and                            allergies were reviewed. The patient's tolerance of                            previous anesthesia was also reviewed. The risks                            and benefits of the procedure and the sedation                            options and risks were discussed with the patient.                            All questions were answered, and informed consent                            was obtained. Prior Anticoagulants: The patient has                            taken no previous anticoagulant or antiplatelet                            agents. ASA Grade Assessment: II - A patient with                            mild systemic disease. After reviewing the risks  and benefits, the patient was deemed in                            satisfactory condition to undergo the procedure.                           After obtaining informed consent, the colonoscope                            was passed under direct vision. Throughout the                            procedure, the patient's blood pressure, pulse, and                            oxygen saturations were monitored continuously.  The                            Olympus PCF-H190DL (#[id number]) Colonoscope was                            introduced through the anus and advanced to the the                            cecum, identified by appendiceal orifice and                            ileocecal valve. The colonoscopy was performed                            without difficulty. The patient tolerated the                            procedure well. The quality of the bowel                            preparation was excellent. The ileocecal valve,                            appendiceal orifice, and rectum were photographed. Scope In: 11:33:56 AM Scope Out: 11:51:13 AM Scope Withdrawal Time: 0 hours 11 minutes 24 seconds  Total Procedure Duration: 0 hours 17 minutes 17 seconds  Findings:                 The perianal and digital rectal examinations were                            normal.                           Three sessile polyps were found in the rectum and                            transverse colon. The polyps were 1 to 2 mm in  size. These polyps were removed with a cold biopsy                            forceps. Resection and retrieval were complete.                           Scattered small and large-mouthed diverticula were                            found in the sigmoid colon.                           Non-bleeding internal hemorrhoids were found during                            retroflexion. The hemorrhoids were medium-sized. Complications:            No immediate complications. Estimated Blood Loss:     Estimated blood loss was minimal. Impression:               - Three 1 to 2 mm polyps in the rectum and in the                            transverse colon, removed with a cold biopsy                            forceps. Resected and retrieved.                           - Diverticulosis in the sigmoid colon.                           - Non-bleeding internal hemorrhoids. Recommendation:            - Patient has a contact number available for                            emergencies. The signs and symptoms of potential                            delayed complications were discussed with the                            patient. Return to normal activities tomorrow.                            Written discharge instructions were provided to the                            patient.                           - Resume previous diet.                           - Continue present medications.                           -  Await pathology results.                           - Repeat colonoscopy in 3 - 5 years for                            surveillance based on pathology results. Mauri Pole, MD 10/06/2020 11:57:50 AM This report has been signed electronically.

## 2020-10-06 NOTE — Progress Notes (Signed)
Called to room to assist during endoscopic procedure.  Patient ID and intended procedure confirmed with present staff. Received instructions for my participation in the procedure from the performing physician.  

## 2020-10-10 ENCOUNTER — Telehealth: Payer: Self-pay

## 2020-10-10 NOTE — Telephone Encounter (Signed)
Left message on follow up call. 

## 2020-10-10 NOTE — Telephone Encounter (Signed)
  Follow up Call-  Call back number 10/06/2020 02/18/2019  Post procedure Call Back phone  # [phone]  Permission to leave phone message Yes Yes  Some recent data might be hidden     Patient questions:  Do you have a fever, pain , or abdominal swelling? No. Pain Score  0 *  Have you tolerated food without any problems? Yes.    Have you been able to return to your normal activities? Yes.    Do you have any questions about your discharge instructions: Diet   No. Medications  No. Follow up visit  No.  Do you have questions or concerns about your Care? No.  Actions: * If pain score is 4 or above: No action needed, pain <4.  1. Have you developed a fever since your procedure? no  2.   Have you had an respiratory symptoms (SOB or cough) since your procedure? no  3.   Have you tested positive for COVID 19 since your procedure no  4.   Have you had any family members/close contacts diagnosed with the COVID 19 since your procedure?  no   If yes to any of these questions please route to Joylene John, RN and Joella Prince, RN

## 2020-10-12 ENCOUNTER — Encounter: Payer: Self-pay | Admitting: Gastroenterology

## 2020-11-30 NOTE — Progress Notes (Signed)
HPI male former smoker followed for OSA complicated by asthma, obesity HST 04/13/17-AHI 13.1/hour, desaturation to 85%, body weight 249 pounds  -----------------------------------------------------------------------------  11/26/19-  50 year old male former smoker followed for OSA complicated by asthma, obesity, Rhinitis medicamentosa CPAP auto 5-12/[employer]/ [employer] Download compliance 70%, AHI 1.7/ hr Body weight 248 lbs -----91yrf/u for OSA. States he has been doing well with his CPAP machine.  Has not had Covax- discussed.  12/01/20- 89year old male former smoker followed for OSA complicated by asthma, obesity, Rhinitis medicamentosa CPAP auto 5-12/[employer]/ [employer]                   AirSense 10 AutoSet Download-compliance 73%, AH/I 2.3/ hr Body weight today- 249 lbs Covid vax- Ok with CPAP but interested in [facility] discussed.  Persistent nasal congestion. Known rhinitis medicamentosa. Saw ENT for this and was advised to get off nasal decongestants. Wife asks about referral to allergist History of seasonal allergy. Wife also notes vasomotor pattern- he sneezes and blows before meals.   ROS-see HPI   + = positive Constitutional:    weight loss, night sweats, fevers, chills, fatigue, lassitude. HEENT:    +headaches, +difficulty swallowing, tooth/dental problems, sore throat,       sneezing, itching, ear ache, +nasal congestion, post nasal drip, +snoring CV:    chest pain, orthopnea, PND, swelling in lower extremities, anasarca,                                                     dizziness, palpitations Resp:   shortness of breath with exertion or at rest.                productive cough,   non-productive cough, coughing up of blood.              change in color of mucus.  wheezing.   Skin:    rash or lesions. GI:  No-   heartburn, indigestion, abdominal pain, nausea, vomiting, diarrhea,                 change in bowel habits, loss of appetite GU: dysuria, change in color of urine, no  urgency or frequency.   flank pain. MS:   joint pain, stiffness, decreased range of motion, back pain. Neuro-     nothing unusual Psych:  change in mood or affect.  depression or anxiety.   memory loss.  OBJ- Physical Exam General- Alert, Oriented, Affect-appropriate, Distress- none acute,+ muscular Skin- rash-none, lesions- none, excoriation- none Lymphadenopathy- none Head- atraumatic            Eyes- Gross vision intact, PERRLA, conjunctivae and secretions clear            Ears- Hearing, canals-normal            Nose-+ turbinate edema, + mild septal dev,  polyps, erosion, perforation             Throat- Mallampati III-IV , mucosa clear , drainage- none, tonsils- atrophic Neck- flexible , trachea midline, no stridor , thyroid nl, carotid no bruit Chest - symmetrical excursion , unlabored           Heart/CV- RRR , no murmur , no gallop  , no rub, nl s1 s2                           -  JVD- none , edema- none, stasis changes- none, varices- none           Lung- clear to P&A, wheeze- none, cough- none , dullness-none, rub- none           Chest wall-  Abd-  Br/ Gen/ Rectal- Not done, not indicated Extrem- cyanosis- none, clubbing, none, atrophy- none, strength- nl Neuro- grossly intact to observation

## 2020-12-01 ENCOUNTER — Other Ambulatory Visit: Payer: Self-pay | Admitting: Internal Medicine

## 2020-12-01 ENCOUNTER — Encounter: Payer: Self-pay | Admitting: Internal Medicine

## 2020-12-01 ENCOUNTER — Other Ambulatory Visit: Payer: Self-pay

## 2020-12-01 ENCOUNTER — Ambulatory Visit: Payer: 59 | Admitting: Internal Medicine

## 2020-12-01 VITALS — BP 122/78 | HR 78 | Ht 71.0 in | Wt 249.6 lb

## 2020-12-01 DIAGNOSIS — J310 Chronic rhinitis: Secondary | ICD-10-CM

## 2020-12-01 DIAGNOSIS — G4733 Obstructive sleep apnea (adult) (pediatric): Secondary | ICD-10-CM

## 2020-12-01 MED ORDER — IPRATROPIUM BROMIDE 0.06 % NA SOLN: 2.0000 | Freq: Four times a day (QID) | NASAL | 12 refills | Status: DC

## 2020-12-01 MED ORDER — FLUTICASONE PROPIONATE 50 MCG/ACT NA SUSP: NASAL | 12 refills | Status: AC

## 2020-12-01 MED ORDER — AZELASTINE-FLUTICASONE 137-50 MCG/ACT NA SUSP: NASAL | 12 refills | Status: DC

## 2020-12-01 NOTE — Telephone Encounter (Signed)
Pharmacy informed us insurance wants to use azelastine and fluticasone as 2 separate products, instead of combined as Dymista. Scripts sent.

## 2020-12-01 NOTE — Patient Instructions (Signed)
Order- Referral to Allergy and Asthma allergists on [address]    dx allergic rhinitis  Order- referral to [facility]   Dr Wilburn Cornelia or Redmond Baseman    Dx obstructive sleep apnea  Script sent for Dymista nasal spray (azelastine-fluticasone)   1-2 puffs each nostril once daily at bedtime  Script sent for ipratropium nasal spray    1-2 puffs each nostril up to 3 times daily, before meals, as needed  Ok to use nasal saline spray and/ or gel as needed

## 2020-12-01 NOTE — Assessment & Plan Note (Signed)
Known rhinitis medicamentosa from overuse of Afrin, Sinex.  We discussed alternative nasal meds while avoiding decongestants.  Discussed ipratropium before meals, use of nasal saline gel or spray, azelastine/ flonase routinely, referral to allergist.

## 2020-12-01 NOTE — Telephone Encounter (Signed)
Insurance does not cover Dymista. Would like to know what medication you would like to give?

## 2020-12-01 NOTE — Assessment & Plan Note (Signed)
It helps when he can use it. Pressure ok. Still struggling with nasal congestion and takes it off in his sleep.  Plan- treat for nasal congestion. ENT referral to learn about Inspire.

## 2021-01-29 ENCOUNTER — Encounter: Payer: Self-pay | Admitting: Physician Assistant

## 2021-01-29 ENCOUNTER — Telehealth: Payer: 59 | Admitting: Physician Assistant

## 2021-01-29 DIAGNOSIS — U071 COVID-19: Secondary | ICD-10-CM

## 2021-01-29 DIAGNOSIS — Z20822 Contact with and (suspected) exposure to covid-19: Secondary | ICD-10-CM

## 2021-01-29 MED ORDER — BENZONATATE 100 MG PO CAPS: 100.0000 mg | ORAL_CAPSULE | Freq: Three times a day (TID) | ORAL | 0 refills | Status: DC | PRN

## 2021-01-29 MED ORDER — MOLNUPIRAVIR EUA 200MG CAPSULE: 4.0000 | ORAL_CAPSULE | Freq: Two times a day (BID) | ORAL | 0 refills | Status: AC

## 2021-01-29 NOTE — Patient Instructions (Signed)
Gilbert Perkins, thank you for joining Leeanne Rio, PA-C for today's virtual visit.  While this provider is not your primary care provider (PCP), if your PCP is located in our provider database this encounter information will be shared with them immediately following your visit.  Consent: (Patient) Gilbert Perkins provided verbal consent for this virtual visit at the beginning of the encounter.  Current Medications:  Current Outpatient Medications:    acyclovir (ZOVIRAX) 400 MG tablet, TAKE 1 TABLET (400 MG TOTAL) BY MOUTH 3 (THREE) TIMES DAILY., Disp: 90 tablet, Rfl: 3   albuterol (PROVENTIL HFA) 108 (90 Base) MCG/ACT inhaler, INHALE 2 PUFFS INTO THE LUNGS EVERY 6 HOURS AS NEEDED FOR WHEEZING OR SHORTNESS OF BREATH, Disp: 6.7 g, Rfl: 11   ALPRAZolam (XANAX) 0.5 MG tablet, TAKE 1 TABLET BY MOUTH EVERYDAY AT BEDTIME, Disp: 30 tablet, Rfl: 0   azelastine (ASTELIN) 0.1 % nasal spray, Place 2 sprays into both nostrils 2 (two) times daily., Disp: 30 mL, Rfl: 12   diclofenac (VOLTAREN) 75 MG EC tablet, TAKE 1 TABLET BY MOUTH TWICE A DAY, Disp: 50 tablet, Rfl: 0   fluticasone (FLONASE) 50 MCG/ACT nasal spray, 2 puffs each nostril once daily, Disp: 16 g, Rfl: 12   ipratropium (ATROVENT) 0.06 % nasal spray, Place 2 sprays into both nostrils 4 (four) times daily., Disp: 15 mL, Rfl: 12   rosuvastatin (CRESTOR) 10 MG tablet, Take 1 tablet (10 mg total) by mouth daily., Disp: 90 tablet, Rfl: 3   tadalafil (CIALIS) 20 MG tablet, TAKE 1/2 TO 1 TABLET BY MOUTH EVERY OTHER DAY AS NEEDED FOR ERECTILE DYSFUNCTION., Disp: 5 tablet, Rfl: 10   traMADol (ULTRAM) 50 MG tablet, Take 1 tablet (50 mg total) by mouth 3 (three) times daily., Disp: 90 tablet, Rfl: 2  Current Facility-Administered Medications:    0.9 %  sodium chloride infusion, 500 mL, Intravenous, Once, Nandigam, Kavitha V, MD   Medications ordered in this encounter:  No orders of the defined types were placed in this encounter.    *If you need  refills on other medications prior to your next appointment, please contact your pharmacy*  Follow-Up: Call back or seek an in-person evaluation if the symptoms worsen or if the condition fails to improve as anticipated.  Other Instructions Please keep well-hydrated and get plenty of rest. Start a saline nasal rinse to flush out your nasal passages. You can use plain Mucinex to help thin congestion. If you have a humidifier, running in the bedroom at night. I want you to start OTC vitamin D3 1000 units daily, vitamin C 1000 mg daily, and a zinc supplement. Please take prescribed medications as directed.  You have been enrolled in a MyChart symptom monitoring program. Please answer these questions daily so we can keep track of how you are doing.  As discussed, please get COVID tested ASAP. Quarantine until you get results. If positive, please let me know via MyChart but review quarantine below.   You were to quarantine for 5 days from onset of your symptoms.  After day 5, if you have had no fever and you are feeling better, you can end quarantine but need to mask for an additional 5 days. After day 5 if you have a fever or are having significant symptoms, please quarantine for full 10 days.  If you note any worsening of symptoms, any significant shortness of breath or any chest pain, please seek ER evaluation ASAP.  Please do not delay care!  COVID-19:  What to Do if You Are Sick If you test positive and are an older adult or someone who is at high risk of getting very sick from COVID-19, treatment may be available. Contact a healthcare provider right away after a positive test to determine if you are eligible, even if your symptoms are mild right now. You can also visit a Test to Treat location and, if eligible, receive a prescription from a provider. Don't delay: Treatment must be started within the first few days to be effective. If you have a fever, cough, or other symptoms, you might have  COVID-19. Most people have mild illness and are able to recover at home. If you are sick: Keep track of your symptoms. If you have an emergency warning sign (including trouble breathing), call 911. Steps to help prevent the spread of COVID-19 if you are sick If you are sick with COVID-19 or think you might have COVID-19, follow the steps below to care for yourself and to help protect other people in your home and community. Stay home except to get medical care Stay home. Most people with COVID-19 have mild illness and can recover at home without medical care. Do not leave your home, except to get medical care. Do not visit public areas and do not go to places where you are unable to wear a mask. Take care of yourself. Get rest and stay hydrated. Take over-the-counter medicines, such as acetaminophen, to help you feel better. Stay in touch with your doctor. Call before you get medical care. Be sure to get care if you have trouble breathing, or have any other emergency warning signs, or if you think it is an emergency. Avoid public transportation, ride-sharing, or taxis if possible. Get tested If you have symptoms of COVID-19, get tested. While waiting for test results, stay away from others, including staying apart from those living in your household. Get tested as soon as possible after your symptoms start. Treatments may be available for people with COVID-19 who are at risk for becoming very sick. Don't delay: Treatment must be started early to be effective--some treatments must begin within 5 days of your first symptoms. Contact your healthcare provider right away if your test result is positive to determine if you are eligible. Self-tests are one of several options for testing for the virus that causes COVID-19 and may be more convenient than laboratory-based tests and point-of-care tests. Ask your healthcare provider or your local health department if you need help interpreting your test  results. You can visit your state, tribal, local, and territorial health department's website to look for the latest local information on testing sites. Separate yourself from other people As much as possible, stay in a specific room and away from other people and pets in your home. If possible, you should use a separate bathroom. If you need to be around other people or animals in or outside of the home, wear a well-fitting mask. Tell your close contacts that they may have been exposed to COVID-19. An infected person can spread COVID-19 starting 48 hours (or 2 days) before the person has any symptoms or tests positive. By letting your close contacts know they may have been exposed to COVID-19, you are helping to protect everyone. See COVID-19 and Animals if you have questions about pets. If you are diagnosed with COVID-19, someone from the health department may call you. Answer the call to slow the spread. Monitor your symptoms Symptoms of COVID-19 include fever, cough, or other  symptoms. Follow care instructions from your healthcare provider and local health department. Your local health authorities may give instructions on checking your symptoms and reporting information. When to seek emergency medical attention Look for emergency warning signs* for COVID-19. If someone is showing any of these signs, seek emergency medical care immediately: Trouble breathing Persistent pain or pressure in the chest New confusion Inability to wake or stay awake Pale, gray, or blue-colored skin, lips, or nail beds, depending on skin tone *This list is not all possible symptoms. Please call your medical provider for any other symptoms that are severe or concerning to you. Call 911 or call ahead to your local emergency facility: Notify the operator that you are seeking care for someone who has or may have COVID-19. Call ahead before visiting your doctor Call ahead. Many medical visits for routine care are being  postponed or done by phone or telemedicine. If you have a medical appointment that cannot be postponed, call your doctor's office, and tell them you have or may have COVID-19. This will help the office protect themselves and other patients. If you are sick, wear a well-fitting mask You should wear a mask if you must be around other people or animals, including pets (even at home). Wear a mask with the best fit, protection, and comfort for you. You don't need to wear the mask if you are alone. If you can't put on a mask (because of trouble breathing, for example), cover your coughs and sneezes in some other way. Try to stay at least 6 feet away from other people. This will help protect the people around you. Masks should not be placed on young children under age 74 years, anyone who has trouble breathing, or anyone who is not able to remove the mask without help. Cover your coughs and sneezes Cover your mouth and nose with a tissue when you cough or sneeze. Throw away used tissues in a lined trash can. Immediately wash your hands with soap and water for at least 20 seconds. If soap and water are not available, clean your hands with an alcohol-based hand sanitizer that contains at least 60% alcohol. Clean your hands often Wash your hands often with soap and water for at least 20 seconds. This is especially important after blowing your nose, coughing, or sneezing; going to the bathroom; and before eating or preparing food. Use hand sanitizer if soap and water are not available. Use an alcohol-based hand sanitizer with at least 60% alcohol, covering all surfaces of your hands and rubbing them together until they feel dry. Soap and water are the best option, especially if hands are visibly dirty. Avoid touching your eyes, nose, and mouth with unwashed hands. Handwashing Tips Avoid sharing personal household items Do not share dishes, drinking glasses, cups, eating utensils, towels, or bedding with other  people in your home. Wash these items thoroughly after using them with soap and water or put in the dishwasher. Clean surfaces in your home regularly Clean and disinfect high-touch surfaces (for example, doorknobs, tables, handles, light switches, and countertops) in your "sick room" and bathroom. In shared spaces, you should clean and disinfect surfaces and items after each use by the person who is ill. If you are sick and cannot clean, a caregiver or other person should only clean and disinfect the area around you (such as your bedroom and bathroom) on an as needed basis. Your caregiver/other person should wait as long as possible (at least several hours) and wear a  mask before entering, cleaning, and disinfecting shared spaces that you use. Clean and disinfect areas that may have blood, stool, or body fluids on them. Use household cleaners and disinfectants. Clean visible dirty surfaces with household cleaners containing soap or detergent. Then, use a household disinfectant. Use a product from [employer] List N: Disinfectants for Coronavirus (RJJOA-41). Be sure to follow the instructions on the label to ensure safe and effective use of the product. Many products recommend keeping the surface wet with a disinfectant for a certain period of time (look at "contact time" on the product label). You may also need to wear personal protective equipment, such as gloves, depending on the directions on the product label. Immediately after disinfecting, wash your hands with soap and water for 20 seconds. For completed guidance on cleaning and disinfecting your home, visit Complete Disinfection Guidance. Take steps to improve ventilation at home Improve ventilation (air flow) at home to help prevent from spreading COVID-19 to other people in your household. Clear out COVID-19 virus particles in the air by opening windows, using air filters, and turning on fans in your home. Use this interactive tool to learn how to  improve air flow in your home. When you can be around others after being sick with COVID-19 Deciding when you can be around others is different for different situations. Find out when you can safely end home isolation. For any additional questions about your care, contact your healthcare provider or state or local health department. 07/25/2020 Content source: [facility] ([facility]), Division of Viral Diseases This information is not intended to replace advice given to you by your health care provider. Make sure you discuss any questions you have with your health care provider. Document Revised: 09/07/2020 Document Reviewed: 09/07/2020 Elsevier Patient Education  2022 [employer].      If you have been instructed to have an in-person evaluation today at a local Urgent Care facility, please use the link below. It will take you to a list of all of our available [facility], including address, phone number and hours of operation. Please do not delay care.  [facility]  If you or a family member do not have a primary care provider, use the link below to schedule a visit and establish care. When you choose a [facility] primary care physician or advanced practice provider, you gain a long-term partner in health. Find a Primary Care Provider  Learn more about 's in-office and virtual care options: [facility]

## 2021-01-29 NOTE — Addendum Note (Signed)
Addended by: Brunetta Jeans on: 01/29/2021 04:31 PM   Modules accepted: Orders
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
## 2021-02-20 ENCOUNTER — Ambulatory Visit: Payer: 59 | Admitting: Allergy and Immunology

## 2021-03-09 ENCOUNTER — Other Ambulatory Visit: Payer: Self-pay | Admitting: Internal Medicine

## 2021-03-11 ENCOUNTER — Other Ambulatory Visit: Payer: Self-pay | Admitting: Internal Medicine

## 2021-03-26 NOTE — Progress Notes (Deleted)
HPI male former smoker followed for OSA complicated by asthma, obesity HST 04/13/17-AHI 13.1/hour, desaturation to 85%, body weight 249 pounds  -----------------------------------------------------------------------------   12/01/20- 50 year old male former smoker followed for OSA complicated by asthma, obesity, Rhinitis medicamentosa CPAP auto 5-12/[facility]/ [facility]                   AirSense 10 AutoSet Download-compliance 73%, AH/I 2.3/ hr Body weight today- 249 lbs Covid vax- Ok with CPAP but interested in [place]- discussed.  Persistent nasal congestion. Known rhinitis medicamentosa. Saw ENT for this and was advised to get off nasal decongestants. Wife asks about referral to allergist History of seasonal allergy. Wife also notes vasomotor pattern- he sneezes and blows before meals.   04/02/21- 50 year old male former smoker followed for OSA complicated by asthma, obesity, Rhinitis medicamentosa CPAP auto 5-12/[facility]/ [facility]                   AirSense 10 AutoSet Download-compliance Body weight today-  Covid vax- Flu vax-   ROS-see HPI   + = positive Constitutional:    weight loss, night sweats, fevers, chills, fatigue, lassitude. HEENT:    +headaches, +difficulty swallowing, tooth/dental problems, sore throat,       sneezing, itching, ear ache, +nasal congestion, post nasal drip, +snoring CV:    chest pain, orthopnea, PND, swelling in lower extremities, anasarca,                                                     dizziness, palpitations Resp:   shortness of breath with exertion or at rest.                productive cough,   non-productive cough, coughing up of blood.              change in color of mucus.  wheezing.   Skin:    rash or lesions. GI:  No-   heartburn, indigestion, abdominal pain, nausea, vomiting, diarrhea,                 change in bowel habits, loss of appetite GU: dysuria, change in color of urine, no urgency or frequency.   flank pain. MS:   joint pain,  stiffness, decreased range of motion, back pain. Neuro-     nothing unusual Psych:  change in mood or affect.  depression or anxiety.   memory loss.  OBJ- Physical Exam General- Alert, Oriented, Affect-appropriate, Distress- none acute,+ muscular Skin- rash-none, lesions- none, excoriation- none Lymphadenopathy- none Head- atraumatic            Eyes- Gross vision intact, PERRLA, conjunctivae and secretions clear            Ears- Hearing, canals-normal            Nose-+ turbinate edema, + mild septal dev,  polyps, erosion, perforation             Throat- Mallampati III-IV , mucosa clear , drainage- none, tonsils- atrophic Neck- flexible , trachea midline, no stridor , thyroid nl, carotid no bruit Chest - symmetrical excursion , unlabored           Heart/CV- RRR , no murmur , no gallop  , no rub, nl s1 s2                           -  JVD- none , edema- none, stasis changes- none, varices- none           Lung- clear to P&A, wheeze- none, cough- none , dullness-none, rub- none           Chest wall-  Abd-  Br/ Gen/ Rectal- Not done, not indicated Extrem- cyanosis- none, clubbing, none, atrophy- none, strength- nl Neuro- grossly intact to observation

## 2021-04-02 ENCOUNTER — Ambulatory Visit: Payer: 59 | Admitting: Internal Medicine

## 2021-04-17 ENCOUNTER — Ambulatory Visit (INDEPENDENT_AMBULATORY_CARE_PROVIDER_SITE_OTHER): Payer: 59 | Admitting: Allergy and Immunology

## 2021-04-17 ENCOUNTER — Encounter: Payer: Self-pay | Admitting: Allergy and Immunology

## 2021-04-17 ENCOUNTER — Other Ambulatory Visit: Payer: Self-pay

## 2021-04-17 VITALS — BP 132/86 | HR 106 | Temp 97.6°F | Resp 18 | Ht 71.0 in | Wt 249.6 lb

## 2021-04-17 DIAGNOSIS — J310 Chronic rhinitis: Secondary | ICD-10-CM

## 2021-04-17 DIAGNOSIS — G4733 Obstructive sleep apnea (adult) (pediatric): Secondary | ICD-10-CM

## 2021-04-17 DIAGNOSIS — J301 Allergic rhinitis due to pollen: Secondary | ICD-10-CM

## 2021-04-17 DIAGNOSIS — J3089 Other allergic rhinitis: Secondary | ICD-10-CM

## 2021-04-17 DIAGNOSIS — J4530 Mild persistent asthma, uncomplicated: Secondary | ICD-10-CM

## 2021-04-17 MED ORDER — RYALTRIS 665-25 MCG/ACT NA SUSP: 2.0000 | Freq: Every day | NASAL | 5 refills | Status: DC

## 2021-04-17 MED ORDER — OXYMETAZOLINE HCL 0.05 % NA SOLN: 1.0000 | Freq: Every evening | NASAL | 5 refills | Status: DC

## 2021-04-17 MED ORDER — MONTELUKAST SODIUM 10 MG PO TABS: 10.0000 mg | ORAL_TABLET | Freq: Every day | ORAL | 5 refills | Status: DC
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
## 2021-04-17 NOTE — Patient Instructions (Addendum)
°  1.  Allergen avoidance measures - pollens  2.  Every evening utilize the following medication:  A. Afrin - 1 spray single nostril. Alternate nostril every evening B. Ryaltris - 2 sprays each nostril (specialty pharmacy) C. Montelukast 10 mg - 1 tablet 1 time per day  3.  Every morning use the following medication:  A. Ryaltris - 2 sprays each nostril (specialty pharmacy)  4. If Needed:  A. OTC Antihistamine B. OTC Pataday - 1 drop each eye 1 time per day C. Albuterol HFA - 2 inhalations every 4-6 hours D. Nasal saline  5.  Consider a course of immunotherapy???  6.  Further treatment for obstructive lung disease???  7.  Return to clinic in 4 weeks or earlier if problem

## 2021-04-18 ENCOUNTER — Encounter: Payer: Self-pay | Admitting: Allergy and Immunology

## 2021-05-03 ENCOUNTER — Ambulatory Visit: Payer: 59 | Admitting: Internal Medicine

## 2021-05-08 ENCOUNTER — Telehealth: Payer: Self-pay | Admitting: Allergy and Immunology

## 2021-05-08 NOTE — Telephone Encounter (Signed)
Patient wife called and he was with her and said that the meds that his other doctor gave him in July was dymista and ipratropium and want to know if he can take those with what you want him to take. [phone].

## 2021-05-08 NOTE — Telephone Encounter (Signed)
He should ONLY use these medications for his ariway issue:  1.  Allergen avoidance measures - pollens  2.  Every evening utilize the following medication:   A. Afrin - 1 spray single nostril. Alternate nostril every evening B. Ryaltris - 2 sprays each nostril (specialty pharmacy) C. Montelukast 10 mg - 1 tablet 1 time per day  3.  Every morning use the following medication:   A. Ryaltris - 2 sprays each nostril (specialty pharmacy)  4. If Needed:   A. OTC Antihistamine B. OTC Pataday - 1 drop each eye 1 time per day C. Albuterol HFA - 2 inhalations every 4-6 hours D. Nasal saline

## 2021-05-08 NOTE — Telephone Encounter (Signed)
Dr Kozlow please advise 

## 2021-05-08 NOTE — Telephone Encounter (Signed)
Wife informed of your instructions.

## 2021-05-09 ENCOUNTER — Other Ambulatory Visit: Payer: Self-pay | Admitting: Internal Medicine

## 2021-05-13 ENCOUNTER — Other Ambulatory Visit: Payer: Self-pay | Admitting: Internal Medicine

## 2021-05-13 DIAGNOSIS — N529 Male erectile dysfunction, unspecified: Secondary | ICD-10-CM

## 2021-05-22 ENCOUNTER — Other Ambulatory Visit: Payer: Self-pay | Admitting: *Deleted

## 2021-05-22 ENCOUNTER — Ambulatory Visit: Payer: 59 | Admitting: Allergy and Immunology

## 2021-05-22 ENCOUNTER — Other Ambulatory Visit: Payer: Self-pay | Admitting: Allergy and Immunology

## 2021-05-22 ENCOUNTER — Other Ambulatory Visit: Payer: Self-pay

## 2021-05-22 VITALS — BP 124/86 | HR 89 | Temp 98.7°F | Resp 18 | Ht 71.0 in | Wt 250.4 lb

## 2021-05-22 DIAGNOSIS — J301 Allergic rhinitis due to pollen: Secondary | ICD-10-CM

## 2021-05-22 DIAGNOSIS — J3089 Other allergic rhinitis: Secondary | ICD-10-CM | POA: Diagnosis not present

## 2021-05-22 DIAGNOSIS — J310 Chronic rhinitis: Secondary | ICD-10-CM

## 2021-05-22 DIAGNOSIS — G4733 Obstructive sleep apnea (adult) (pediatric): Secondary | ICD-10-CM

## 2021-05-22 DIAGNOSIS — J449 Chronic obstructive pulmonary disease, unspecified: Secondary | ICD-10-CM

## 2021-05-22 MED ORDER — RYALTRIS 665-25 MCG/ACT NA SUSP: 2.0000 | Freq: Every day | NASAL | 5 refills | Status: DC

## 2021-05-22 MED ORDER — LEVOCETIRIZINE DIHYDROCHLORIDE 5 MG PO TABS: 5.0000 mg | ORAL_TABLET | Freq: Two times a day (BID) | ORAL | 5 refills | Status: DC | PRN

## 2021-05-22 MED ORDER — OXYMETAZOLINE HCL 0.05 % NA SOLN: 1.0000 | Freq: Every evening | NASAL | 5 refills | Status: AC

## 2021-05-22 MED ORDER — MONTELUKAST SODIUM 10 MG PO TABS: 10.0000 mg | ORAL_TABLET | Freq: Every day | ORAL | 5 refills | Status: DC

## 2021-05-22 MED ORDER — OLOPATADINE HCL 0.1 % OP SOLN: 1.0000 [drp] | Freq: Two times a day (BID) | OPHTHALMIC | 5 refills | Status: DC | PRN

## 2021-05-22 MED ORDER — OLOPATADINE HCL 0.2 % OP SOLN: 1.0000 [drp] | OPHTHALMIC | 5 refills | Status: DC
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
## 2021-05-22 NOTE — Telephone Encounter (Signed)
Can we switch to Azelastine eye drops or Olopatadine 0.1%?

## 2021-05-22 NOTE — Patient Instructions (Signed)
°  1.  Allergen avoidance measures - pollens  2.  Every evening utilize the following medication:  A. Afrin - 1 spray single nostril. Alternate nostril every evening B. Ryaltris - 2 sprays each nostril   C. Montelukast 10 mg - 1 tablet 1 time per day  3.  Every morning use the following medication:  A. Ryaltris - 2 sprays each nostril    4. If Needed:  A. OTC Antihistamine B. OTC Pataday - 1 drop each eye 1 time per day C. Albuterol HFA - 2 inhalations every 4-6 hours D. Nasal saline  5. Return to clinic in 6 months or earlier if problem  6. Further treatment for obstructive lung disease???  Exercise limitation???  8. Different plan for pollination season??? Immunotherapy???

## 2021-05-23 ENCOUNTER — Encounter: Payer: Self-pay | Admitting: Allergy and Immunology

## 2021-07-14 ENCOUNTER — Other Ambulatory Visit: Payer: Self-pay | Admitting: Internal Medicine

## 2021-08-14 ENCOUNTER — Other Ambulatory Visit: Payer: Self-pay | Admitting: Internal Medicine

## 2021-09-18 ENCOUNTER — Other Ambulatory Visit: Payer: Self-pay | Admitting: Internal Medicine

## 2021-10-02 ENCOUNTER — Encounter: Payer: 59 | Admitting: Internal Medicine

## 2021-10-02 ENCOUNTER — Encounter: Payer: Self-pay | Admitting: Internal Medicine

## 2021-10-02 NOTE — Progress Notes (Unsigned)
Subjective:    Patient ID: Gilbert Perkins, male    DOB: 09/15/1970, 51 y.o.   MRN: [id number]     HPI Gilbert Perkins is here for  Chief Complaint  Patient presents with   Annual Exam     He denies any changes in his health since he was here last.  He has no concerns.  He does need refills of his medications.   Medications and allergies reviewed with patient and updated if appropriate.  Current Outpatient Medications on File Prior to Visit  Medication Sig Dispense Refill   albuterol (PROVENTIL HFA) 108 (90 Base) MCG/ACT inhaler INHALE 2 PUFFS INTO THE LUNGS EVERY 6 HOURS AS NEEDED FOR WHEEZING OR SHORTNESS OF BREATH 6.7 g 11   fluticasone (FLONASE) 50 MCG/ACT nasal spray 2 puffs each nostril once daily 16 g 12   montelukast (SINGULAIR) 10 MG tablet Take 1 tablet (10 mg total) by mouth at bedtime. 30 tablet 5   Olopatadine HCl (PATADAY) 0.2 % SOLN Place 1 drop into both eyes 1 day or 1 dose. 2.5 mL 5   oxymetazoline (AFRIN NASAL SPRAY) 0.05 % nasal spray Place 1 spray into both nostrils at bedtime. One nostril every night 30 mL 5   No current facility-administered medications on file prior to visit.    Review of Systems  Constitutional:  Negative for chills and fever.  Eyes:  Negative for visual disturbance.  Respiratory:  Negative for cough, shortness of breath and wheezing.   Cardiovascular:  Negative for chest pain, palpitations and leg swelling.  Gastrointestinal:  Negative for abdominal pain, blood in stool, constipation, diarrhea and nausea.       No gerd  Genitourinary:  Negative for difficulty urinating, dysuria and hematuria.  Musculoskeletal:  Positive for arthralgias. Negative for back pain.  Skin:  Negative for rash.  Neurological:  Positive for headaches. Negative for light-headedness.  Psychiatric/Behavioral:  Negative for dysphoric mood. The patient is not nervous/anxious.       Objective:   Vitals:   10/03/21 1024  BP: 130/78  Pulse: 82  Temp: 98.6 F (37  C)  SpO2: 98%   Filed Weights   10/03/21 1024  Weight: 245 lb (111.1 kg)   Body mass index is 34.17 kg/m.  BP Readings from Last 3 Encounters:  10/03/21 130/78  05/22/21 124/86  04/17/21 132/86    Wt Readings from Last 3 Encounters:  10/03/21 245 lb (111.1 kg)  05/22/21 250 lb 6.4 oz (113.6 kg)  04/17/21 249 lb 9.6 oz (113.2 kg)      Physical Exam Constitutional: He appears well-developed and well-nourished. No distress.  HENT:  Head: Normocephalic and atraumatic.  Right Ear: External ear normal.  Left Ear: External ear normal.  Mouth/Throat: Oropharynx is clear and moist.  Normal ear canals and TM b/l  Eyes: Conjunctivae and EOM are normal.  Neck: Neck supple. No tracheal deviation present. No thyromegaly present.  No carotid bruit  Cardiovascular: Normal rate, regular rhythm, normal heart sounds and intact distal pulses.   No murmur heard. Pulmonary/Chest: Effort normal and breath sounds normal. No respiratory distress. He has no wheezes. He has no rales.  Abdominal: Soft. He exhibits no distension. There is no tenderness.  Genitourinary: deferred  Musculoskeletal: He exhibits no edema.  Lymphadenopathy:   He has no cervical adenopathy.  Skin: Skin is warm and dry. He is not diaphoretic.  Psychiatric: He has a normal mood and affect. His behavior is normal.  Assessment & Plan:   Physical exam: Screening blood work  ordered Exercise   some-ride his bike Weight encouraged weight loss Substance abuse   none   Reviewed recommended immunizations.   Health Maintenance  Topic Date Due   COVID-19 Vaccine (1) 10/19/2021 (Originally 12/26/1970)   Zoster Vaccines- Shingrix (1 of 2) 01/03/2022 (Originally 06/28/2018)   TETANUS/TDAP  10/04/2022 (Originally 05/06/2017)   INFLUENZA VACCINE  12/04/2021   COLONOSCOPY (Pts 45-6yr Insurance coverage will need to be confirmed)  10/06/2025   Hepatitis C Screening  Completed   HIV Screening  Completed   HPV  VACCINES  Aged Out     See Problem List for Assessment and Plan of chronic medical problems.

## 2021-10-02 NOTE — Patient Instructions (Addendum)
Blood work was ordered.     Medications changes include :   none   Your prescription(s) have been sent to your pharmacy.     Return in about 1 year (around 10/04/2022) for Physical Exam.    Health Maintenance, Male Adopting a healthy lifestyle and getting preventive care are important in promoting health and wellness. Ask your health care provider about: The right schedule for you to have regular tests and exams. Things you can do on your own to prevent diseases and keep yourself healthy. What should I know about diet, weight, and exercise? Eat a healthy diet  Eat a diet that includes plenty of vegetables, fruits, low-fat dairy products, and lean protein. Do not eat a lot of foods that are high in solid fats, added sugars, or sodium. Maintain a healthy weight Body mass index (BMI) is a measurement that can be used to identify possible weight problems. It estimates body fat based on height and weight. Your health care provider can help determine your BMI and help you achieve or maintain a healthy weight. Get regular exercise Get regular exercise. This is one of the most important things you can do for your health. Most adults should: Exercise for at least 150 minutes each week. The exercise should increase your heart rate and make you sweat (moderate-intensity exercise). Do strengthening exercises at least twice a week. This is in addition to the moderate-intensity exercise. Spend less time sitting. Even light physical activity can be beneficial. Watch cholesterol and blood lipids Have your blood tested for lipids and cholesterol at 53 years of age, then have this test every 5 years. You may need to have your cholesterol levels checked more often if: Your lipid or cholesterol levels are high. You are older than 53 years of age. You are at high risk for heart disease. What should I know about cancer screening? Many types of cancers can be detected early and may often be  prevented. Depending on your health history and family history, you may need to have cancer screening at various ages. This may include screening for: Colorectal cancer. Prostate cancer. Skin cancer. Lung cancer. What should I know about heart disease, diabetes, and high blood pressure? Blood pressure and heart disease High blood pressure causes heart disease and increases the risk of stroke. This is more likely to develop in people who have high blood pressure readings or are overweight. Talk with your health care provider about your target blood pressure readings. Have your blood pressure checked: Every 3-5 years if you are 65-5 years of age. Every year if you are 9 years old or older. If you are between the ages of 110 and 8 and are a current or former smoker, ask your health care provider if you should have a one-time screening for abdominal aortic aneurysm (AAA). Diabetes Have regular diabetes screenings. This checks your fasting blood sugar level. Have the screening done: Once every three years after age 75 if you are at a normal weight and have a low risk for diabetes. More often and at a younger age if you are overweight or have a high risk for diabetes. What should I know about preventing infection? Hepatitis B If you have a higher risk for hepatitis B, you should be screened for this virus. Talk with your health care provider to find out if you are at risk for hepatitis B infection. Hepatitis C Blood testing is recommended for: Everyone born from [birth year] through 1967. Anyone with known  risk factors for hepatitis C. Sexually transmitted infections (STIs) You should be screened each year for STIs, including gonorrhea and chlamydia, if: You are sexually active and are younger than 53 years of age. You are older than 53 years of age and your health care provider tells you that you are at risk for this type of infection. Your sexual activity has changed since you were last screened,  and you are at increased risk for chlamydia or gonorrhea. Ask your health care provider if you are at risk. Ask your health care provider about whether you are at high risk for HIV. Your health care provider may recommend a prescription medicine to help prevent HIV infection. If you choose to take medicine to prevent HIV, you should first get tested for HIV. You should then be tested every 3 months for as long as you are taking the medicine. Follow these instructions at home: Alcohol use Do not drink alcohol if your health care provider tells you not to drink. If you drink alcohol: Limit how much you have to 0-2 drinks a day. Know how much alcohol is in your drink. In the [place] one drink equals one 12 oz bottle of beer (355 mL), one 5 oz glass of wine (148 mL), or one 1 oz glass of hard liquor (44 mL). Lifestyle Do not use any products that contain nicotine or tobacco. These products include cigarettes, chewing tobacco, and vaping devices, such as e-cigarettes. If you need help quitting, ask your health care provider. Do not use street drugs. Do not share needles. Ask your health care provider for help if you need support or information about quitting drugs. General instructions Schedule regular health, dental, and eye exams. Stay current with your vaccines. Tell your health care provider if: You often feel depressed. You have ever been abused or do not feel safe at home. Summary Adopting a healthy lifestyle and getting preventive care are important in promoting health and wellness. Follow your health care provider's instructions about healthy diet, exercising, and getting tested or screened for diseases. Follow your health care provider's instructions on monitoring your cholesterol and blood pressure. This information is not intended to replace advice given to you by your health care provider. Make sure you discuss any questions you have with your health care provider. Document Revised:  09/11/2020 Document Reviewed: 09/11/2020 Elsevier Patient Education  [place].

## 2021-10-03 ENCOUNTER — Ambulatory Visit (INDEPENDENT_AMBULATORY_CARE_PROVIDER_SITE_OTHER): Payer: 59 | Admitting: Internal Medicine

## 2021-10-03 VITALS — BP 130/78 | HR 82 | Temp 98.6°F | Ht 71.0 in | Wt 245.0 lb

## 2021-10-03 DIAGNOSIS — R7303 Prediabetes: Secondary | ICD-10-CM | POA: Diagnosis not present

## 2021-10-03 DIAGNOSIS — Z0000 Encounter for general adult medical examination without abnormal findings: Secondary | ICD-10-CM

## 2021-10-03 DIAGNOSIS — R5383 Other fatigue: Secondary | ICD-10-CM | POA: Insufficient documentation

## 2021-10-03 DIAGNOSIS — F419 Anxiety disorder, unspecified: Secondary | ICD-10-CM

## 2021-10-03 DIAGNOSIS — E782 Mixed hyperlipidemia: Secondary | ICD-10-CM

## 2021-10-03 DIAGNOSIS — J4520 Mild intermittent asthma, uncomplicated: Secondary | ICD-10-CM

## 2021-10-03 DIAGNOSIS — Z125 Encounter for screening for malignant neoplasm of prostate: Secondary | ICD-10-CM | POA: Diagnosis not present

## 2021-10-03 DIAGNOSIS — N529 Male erectile dysfunction, unspecified: Secondary | ICD-10-CM

## 2021-10-03 LAB — CBC WITH DIFFERENTIAL/PLATELET
Basophils Absolute: 0.1 10*3/uL (ref 0.0–0.1)
Basophils Relative: 1.1 % (ref 0.0–3.0)
Eosinophils Absolute: 0.3 10*3/uL (ref 0.0–0.7)
Eosinophils Relative: 5.6 % — ABNORMAL HIGH (ref 0.0–5.0)
HCT: 47.2 % (ref 39.0–52.0)
Hemoglobin: 15.9 g/dL (ref 13.0–17.0)
Lymphocytes Relative: 25.1 % (ref 12.0–46.0)
Lymphs Abs: 1.5 10*3/uL (ref 0.7–4.0)
MCHC: 33.8 g/dL (ref 30.0–36.0)
MCV: 86.3 fl (ref 78.0–100.0)
Monocytes Absolute: 0.6 10*3/uL (ref 0.1–1.0)
Monocytes Relative: 10.1 % (ref 3.0–12.0)
Neutro Abs: 3.4 10*3/uL (ref 1.4–7.7)
Neutrophils Relative %: 58.1 % (ref 43.0–77.0)
Platelets: 250 10*3/uL (ref 150.0–400.0)
RBC: 5.47 Mil/uL (ref 4.22–5.81)
RDW: 13.4 % (ref 11.5–15.5)
WBC: 5.9 10*3/uL (ref 4.0–10.5)

## 2021-10-03 LAB — LIPID PANEL
Cholesterol: 245 mg/dL — ABNORMAL HIGH (ref 0–200)
HDL: 36.1 mg/dL — ABNORMAL LOW (ref >39.00)
NonHDL: 208.92
Total CHOL/HDL Ratio: 7
Triglycerides: 238 mg/dL — ABNORMAL HIGH (ref 0.0–149.0)
VLDL: 47.6 mg/dL — ABNORMAL HIGH (ref 0.0–40.0)

## 2021-10-03 LAB — COMPREHENSIVE METABOLIC PANEL
ALT: 26 U/L (ref 0–53)
AST: 24 U/L (ref 0–37)
Albumin: 4.4 g/dL (ref 3.5–5.2)
Alkaline Phosphatase: 54 U/L (ref 39–117)
BUN: 11 mg/dL (ref 6–23)
CO2: 29 mEq/L (ref 19–32)
Calcium: 10.1 mg/dL (ref 8.4–10.5)
Chloride: 102 mEq/L (ref 96–112)
Creatinine, Ser: 1.17 mg/dL (ref 0.40–1.50)
GFR: 71.29 mL/min (ref >60.00)
Glucose, Bld: 105 mg/dL — ABNORMAL HIGH (ref 70–99)
Potassium: 4.6 mEq/L (ref 3.5–5.1)
Sodium: 140 mEq/L (ref 135–145)
Total Bilirubin: 0.6 mg/dL (ref 0.2–1.2)
Total Protein: 6.9 g/dL (ref 6.0–8.3)

## 2021-10-03 LAB — HEMOGLOBIN A1C: Hgb A1c MFr Bld: 6 % (ref 4.6–6.5)

## 2021-10-03 LAB — LDL CHOLESTEROL, DIRECT: Direct LDL: 160 mg/dL

## 2021-10-03 LAB — PSA: PSA: 0.51 ng/mL (ref 0.10–4.00)

## 2021-10-03 MED ORDER — SILDENAFIL CITRATE 20 MG PO TABS: ORAL_TABLET | ORAL | 2 refills | Status: DC

## 2021-10-03 MED ORDER — ACYCLOVIR 400 MG PO TABS
400.0000 mg | ORAL_TABLET | Freq: Three times a day (TID) | ORAL | 3 refills | Status: DC
Start: 2021-10-03 — End: 2022-10-08

## 2021-10-03 MED ORDER — ROSUVASTATIN CALCIUM 10 MG PO TABS: 10.0000 mg | ORAL_TABLET | Freq: Every day | ORAL | 3 refills | Status: DC

## 2021-10-03 MED ORDER — ALPRAZOLAM 0.25 MG PO TABS: 0.2500 mg | ORAL_TABLET | Freq: Every evening | ORAL | 5 refills | Status: DC | PRN

## 2021-10-03 NOTE — Assessment & Plan Note (Signed)
New He states decreased energy and wonders about his testosterone level and would like it checked-testosterone free and total ordered

## 2021-10-03 NOTE — Assessment & Plan Note (Addendum)
Chronic Would like to try sildenafil instead of the Cialis-this seemed to have worked better [employer] sildenafil 40-100 mg daily as needed

## 2021-10-03 NOTE — Assessment & Plan Note (Addendum)
Chronic Management per Dr. Neldon Mc Taking montelukast 10 mg nightly Continue albuterol inhaler as needed

## 2021-10-03 NOTE — Assessment & Plan Note (Addendum)
Chronic Regular exercise and healthy diet encouraged Check lipid panel, CMP, CBC Has been off of medication for couple of months-restart rosuvastatin 10 mg daily

## 2021-10-03 NOTE — Assessment & Plan Note (Signed)
Chronic Check a1c Low sugar / carb diet Stressed regular exercise  

## 2021-10-03 NOTE — Assessment & Plan Note (Addendum)
Chronic Controlled, Stable Continue Xanax 0.25 mg daily at bedtime which helps him tolerate his CPAP

## 2021-10-07 LAB — TESTOSTERONE, FREE & TOTAL
Free Testosterone: 70.1 pg/mL (ref 35.0–155.0)
Testosterone, Total, LC-MS-MS: 461 ng/dL (ref 250–1100)

## 2021-11-20 ENCOUNTER — Ambulatory Visit: Payer: 59 | Admitting: Allergy and Immunology

## 2022-02-12 ENCOUNTER — Ambulatory Visit: Payer: 59 | Admitting: Allergy and Immunology

## 2022-02-24 ENCOUNTER — Other Ambulatory Visit: Payer: Self-pay

## 2022-02-24 ENCOUNTER — Ambulatory Visit (HOSPITAL_COMMUNITY)
Admission: EM | Admit: 2022-02-24 | Discharge: 2022-02-24 | Disposition: A | Discharge status: Home / Self Care | Payer: 59 | Attending: Emergency Medicine | Admitting: Emergency Medicine

## 2022-02-24 ENCOUNTER — Encounter (HOSPITAL_COMMUNITY): Payer: Self-pay | Admitting: Emergency Medicine

## 2022-02-24 DIAGNOSIS — S4991XA Unspecified injury of right shoulder and upper arm, initial encounter: Secondary | ICD-10-CM | POA: Diagnosis not present

## 2022-02-24 DIAGNOSIS — T1490XA Injury, unspecified, initial encounter: Secondary | ICD-10-CM | POA: Diagnosis not present

## 2022-02-24 MED ORDER — CYCLOBENZAPRINE HCL 10 MG PO TABS: 10.0000 mg | ORAL_TABLET | Freq: Two times a day (BID) | ORAL | 0 refills | Status: AC | PRN
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
## 2022-02-24 NOTE — Discharge Instructions (Addendum)
Try to avoid heavy lifting and high impact activity You can apply ice for 20 minutes a few times daily  Ibuprofen up to 800 mg every 6 hours for pain and inflammation The muscle relaxer can be taken twice daily, if it makes you drowsy take only before bed  Follow up with orthopedics tomorrow for further evaluation

## 2022-02-24 NOTE — ED Triage Notes (Signed)
Patient was hanging sheet rock yesterday.  Patient at one point when lifting sheet rock, heard and felt a "tearing or grinding sound/feeling" in antecubital area of right arm.  Points to pain from mid upper arm to mid lower arm .    Has used ibuprofen.  Radial pulse 2+, moves fingers without difficulty.  Describes picking up phone in certain ways that is "excruciating"

## 2022-02-25 ENCOUNTER — Other Ambulatory Visit: Payer: Self-pay | Admitting: Internal Medicine

## 2022-03-12 ENCOUNTER — Encounter (HOSPITAL_COMMUNITY): Payer: Self-pay | Admitting: Orthopedic Surgery

## 2022-03-12 NOTE — Progress Notes (Signed)
Mr. Corp denies chest pain or shortness of breath. Patient denies having any s/s of Covid in his household, also denies any known exposure to Covid.   Mr. Aguilera PCP is Dr. Billey Gosling.

## 2022-03-14 ENCOUNTER — Ambulatory Visit (HOSPITAL_COMMUNITY)
Admission: RE | Admit: 2022-03-14 | Discharge: 2022-03-14 | Disposition: A | Discharge status: Home / Self Care | Payer: 59 | Source: Ambulatory Visit | Attending: Orthopedic Surgery | Admitting: Orthopedic Surgery

## 2022-03-14 ENCOUNTER — Ambulatory Visit (HOSPITAL_COMMUNITY): Payer: 59

## 2022-03-14 ENCOUNTER — Ambulatory Visit (HOSPITAL_BASED_OUTPATIENT_CLINIC_OR_DEPARTMENT_OTHER): Payer: 59 | Admitting: Certified Registered Nurse Anesthetist

## 2022-03-14 ENCOUNTER — Encounter (HOSPITAL_COMMUNITY)
Admission: RE | Disposition: A | Discharge status: Home / Self Care | Payer: Self-pay | Source: Ambulatory Visit | Attending: Orthopedic Surgery

## 2022-03-14 ENCOUNTER — Encounter (HOSPITAL_COMMUNITY): Payer: Self-pay | Admitting: Orthopedic Surgery

## 2022-03-14 ENCOUNTER — Ambulatory Visit (HOSPITAL_COMMUNITY): Payer: 59 | Admitting: Certified Registered Nurse Anesthetist

## 2022-03-14 DIAGNOSIS — Z9989 Dependence on other enabling machines and devices: Secondary | ICD-10-CM

## 2022-03-14 DIAGNOSIS — G4730 Sleep apnea, unspecified: Secondary | ICD-10-CM | POA: Insufficient documentation

## 2022-03-14 DIAGNOSIS — G4733 Obstructive sleep apnea (adult) (pediatric): Secondary | ICD-10-CM

## 2022-03-14 DIAGNOSIS — Z87891 Personal history of nicotine dependence: Secondary | ICD-10-CM | POA: Diagnosis not present

## 2022-03-14 DIAGNOSIS — J449 Chronic obstructive pulmonary disease, unspecified: Secondary | ICD-10-CM | POA: Insufficient documentation

## 2022-03-14 DIAGNOSIS — F419 Anxiety disorder, unspecified: Secondary | ICD-10-CM | POA: Insufficient documentation

## 2022-03-14 DIAGNOSIS — S46211D Strain of muscle, fascia and tendon of other parts of biceps, right arm, subsequent encounter: Secondary | ICD-10-CM | POA: Diagnosis not present

## 2022-03-14 DIAGNOSIS — S46211A Strain of muscle, fascia and tendon of other parts of biceps, right arm, initial encounter: Secondary | ICD-10-CM | POA: Diagnosis present

## 2022-03-14 DIAGNOSIS — X58XXXA Exposure to other specified factors, initial encounter: Secondary | ICD-10-CM | POA: Diagnosis not present

## 2022-03-14 HISTORY — PX: DISTAL BICEPS TENDON REPAIR: SHX1461

## 2022-03-14 LAB — BASIC METABOLIC PANEL
Anion gap: 12 (ref 5–15)
BUN: 11 mg/dL (ref 6–20)
CO2: 22 mmol/L (ref 22–32)
Calcium: 9.1 mg/dL (ref 8.9–10.3)
Chloride: 105 mmol/L (ref 98–111)
Creatinine, Ser: 1.08 mg/dL (ref 0.61–1.24)
GFR, Estimated: >60 mL/min (ref >60)
Glucose, Bld: 92 mg/dL (ref 70–99)
Potassium: 3.8 mmol/L (ref 3.5–5.1)
Sodium: 139 mmol/L (ref 135–145)

## 2022-03-14 LAB — CBC
HCT: 51.2 % (ref 39.0–52.0)
Hemoglobin: 16.9 g/dL (ref 13.0–17.0)
MCH: 29.4 pg (ref 26.0–34.0)
MCHC: 33 g/dL (ref 30.0–36.0)
MCV: 89.2 fL (ref 80.0–100.0)
Platelets: 286 10*3/uL (ref 150–400)
RBC: 5.74 MIL/uL (ref 4.22–5.81)
RDW: 12.4 % (ref 11.5–15.5)
WBC: 8.2 10*3/uL (ref 4.0–10.5)
nRBC: 0 % (ref 0.0–0.2)

## 2022-03-14 SURGERY — REPAIR, TENDON, BICEPS, DISTAL
Anesthesia: General | Laterality: Right

## 2022-03-14 MED ORDER — ACETAMINOPHEN 500 MG PO TABS: 1000.0000 mg | ORAL_TABLET | Freq: Once | ORAL | Status: DC

## 2022-03-14 MED ORDER — BUPIVACAINE-EPINEPHRINE (PF) 0.5% -1:200000 IJ SOLN
INTRAMUSCULAR | Status: DC | PRN
  Administered 2022-03-14: 10 mL via PERINEURAL

## 2022-03-14 MED ORDER — HYDROMORPHONE HCL 1 MG/ML IJ SOLN
INTRAMUSCULAR | Status: AC
  Filled 2022-03-14: qty 1

## 2022-03-14 MED ORDER — PROMETHAZINE HCL 25 MG/ML IJ SOLN: 6.2500 mg | INTRAMUSCULAR | Status: DC | PRN

## 2022-03-14 MED ORDER — OXYCODONE HCL 5 MG/5ML PO SOLN: 5.0000 mg | Freq: Once | ORAL | Status: DC | PRN

## 2022-03-14 MED ORDER — FENTANYL CITRATE (PF) 250 MCG/5ML IJ SOLN
INTRAMUSCULAR | Status: AC
  Filled 2022-03-14: qty 5

## 2022-03-14 MED ORDER — CHLORHEXIDINE GLUCONATE 0.12 % MT SOLN
15.0000 mL | OROMUCOSAL | Status: AC
  Administered 2022-03-14: 15 mL via OROMUCOSAL
  Filled 2022-03-14: qty 15

## 2022-03-14 MED ORDER — ONDANSETRON HCL 4 MG/2ML IJ SOLN
INTRAMUSCULAR | Status: DC | PRN
  Administered 2022-03-14: 4 mg via INTRAVENOUS

## 2022-03-14 MED ORDER — HYDROMORPHONE HCL 1 MG/ML IJ SOLN
0.2500 mg | INTRAMUSCULAR | Status: DC | PRN
  Administered 2022-03-14 (×4): 0.5 mg via INTRAVENOUS

## 2022-03-14 MED ORDER — MIDAZOLAM HCL 2 MG/2ML IJ SOLN
INTRAMUSCULAR | Status: AC
  Administered 2022-03-14: 2 mg via INTRAVENOUS
  Filled 2022-03-14: qty 2

## 2022-03-14 MED ORDER — DEXAMETHASONE SODIUM PHOSPHATE 10 MG/ML IJ SOLN
INTRAMUSCULAR | Status: AC
  Filled 2022-03-14: qty 1

## 2022-03-14 MED ORDER — OXYCODONE HCL 5 MG PO TABS: 5.0000 mg | ORAL_TABLET | Freq: Once | ORAL | Status: DC | PRN

## 2022-03-14 MED ORDER — LIDOCAINE 2% (20 MG/ML) 5 ML SYRINGE
INTRAMUSCULAR | Status: AC
  Filled 2022-03-14: qty 10

## 2022-03-14 MED ORDER — FENTANYL CITRATE (PF) 100 MCG/2ML IJ SOLN
100.0000 ug | Freq: Once | INTRAMUSCULAR | Status: AC
  Filled 2022-03-14: qty 2

## 2022-03-14 MED ORDER — 0.9 % SODIUM CHLORIDE (POUR BTL) OPTIME
TOPICAL | Status: DC | PRN
  Administered 2022-03-14: 1000 mL

## 2022-03-14 MED ORDER — ONDANSETRON HCL 4 MG/2ML IJ SOLN
INTRAMUSCULAR | Status: AC
  Filled 2022-03-14: qty 6

## 2022-03-14 MED ORDER — DEXAMETHASONE SODIUM PHOSPHATE 10 MG/ML IJ SOLN
INTRAMUSCULAR | Status: DC | PRN
  Administered 2022-03-14: 10 mg via INTRAVENOUS

## 2022-03-14 MED ORDER — ROCURONIUM BROMIDE 10 MG/ML (PF) SYRINGE
PREFILLED_SYRINGE | INTRAVENOUS | Status: AC
  Filled 2022-03-14: qty 40

## 2022-03-14 MED ORDER — MIDAZOLAM HCL 2 MG/2ML IJ SOLN
2.0000 mg | Freq: Once | INTRAMUSCULAR | Status: AC
  Filled 2022-03-14: qty 2

## 2022-03-14 MED ORDER — SUGAMMADEX SODIUM 500 MG/5ML IV SOLN
INTRAVENOUS | Status: AC
  Filled 2022-03-14: qty 5

## 2022-03-14 MED ORDER — PROPOFOL 10 MG/ML IV BOLUS
INTRAVENOUS | Status: DC | PRN
  Administered 2022-03-14: 200 mg via INTRAVENOUS

## 2022-03-14 MED ORDER — FENTANYL CITRATE (PF) 100 MCG/2ML IJ SOLN
INTRAMUSCULAR | Status: AC
  Administered 2022-03-14: 100 ug via INTRAVENOUS
  Filled 2022-03-14: qty 2

## 2022-03-14 MED ORDER — CEFAZOLIN SODIUM-DEXTROSE 2-4 GM/100ML-% IV SOLN
2.0000 g | INTRAVENOUS | Status: AC
  Administered 2022-03-14: 2 g via INTRAVENOUS
  Filled 2022-03-14: qty 100

## 2022-03-14 MED ORDER — PROPOFOL 10 MG/ML IV BOLUS
INTRAVENOUS | Status: AC
  Filled 2022-03-14: qty 20

## 2022-03-14 MED ORDER — ROCURONIUM BROMIDE 10 MG/ML (PF) SYRINGE
PREFILLED_SYRINGE | INTRAVENOUS | Status: DC | PRN
  Administered 2022-03-14: 20 mg via INTRAVENOUS
  Administered 2022-03-14: 60 mg via INTRAVENOUS

## 2022-03-14 MED ORDER — LACTATED RINGERS IV SOLN: INTRAVENOUS | Status: DC

## 2022-03-14 MED ORDER — MIDAZOLAM HCL 2 MG/2ML IJ SOLN: 0.5000 mg | Freq: Once | INTRAMUSCULAR | Status: DC | PRN

## 2022-03-14 MED ORDER — SUGAMMADEX SODIUM 200 MG/2ML IV SOLN
INTRAVENOUS | Status: DC | PRN
  Administered 2022-03-14: 426.4 mg via INTRAVENOUS

## 2022-03-14 MED ORDER — MIDAZOLAM HCL 2 MG/2ML IJ SOLN
INTRAMUSCULAR | Status: AC
  Filled 2022-03-14: qty 2

## 2022-03-14 MED ORDER — MEPERIDINE HCL 25 MG/ML IJ SOLN: 6.2500 mg | INTRAMUSCULAR | Status: DC | PRN

## 2022-03-14 MED ORDER — DIPHENHYDRAMINE HCL 50 MG/ML IJ SOLN
INTRAMUSCULAR | Status: AC
  Filled 2022-03-14: qty 1

## 2022-03-14 MED ORDER — FENTANYL CITRATE (PF) 250 MCG/5ML IJ SOLN
INTRAMUSCULAR | Status: DC | PRN
  Administered 2022-03-14: 50 ug via INTRAVENOUS
  Administered 2022-03-14: 100 ug via INTRAVENOUS
  Administered 2022-03-14: 150 ug via INTRAVENOUS

## 2022-03-14 MED ORDER — MIDAZOLAM HCL 2 MG/2ML IJ SOLN
INTRAMUSCULAR | Status: DC | PRN
  Administered 2022-03-14: 2 mg via INTRAVENOUS

## 2022-03-14 MED ORDER — LIDOCAINE 2% (20 MG/ML) 5 ML SYRINGE
INTRAMUSCULAR | Status: DC | PRN
  Administered 2022-03-14: 20 mg via INTRAVENOUS

## 2022-03-14 SURGICAL SUPPLY — 53 items
BAG COUNTER SPONGE SURGICOUNT (BAG) ×2 IMPLANT
BAG SPNG CNTER NS LX DISP (BAG) ×1
BNDG CMPR 5X4 CHSV STRCH STRL (GAUZE/BANDAGES/DRESSINGS)
BNDG CMPR 9X4 STRL LF SNTH (GAUZE/BANDAGES/DRESSINGS) ×1
BNDG COHESIVE 4X5 TAN STRL LF (GAUZE/BANDAGES/DRESSINGS) ×2 IMPLANT
BNDG ELASTIC 4X5.8 VLCR STR LF (GAUZE/BANDAGES/DRESSINGS) ×2 IMPLANT
BNDG ESMARK 4X9 LF (GAUZE/BANDAGES/DRESSINGS) IMPLANT
BNDG GAUZE DERMACEA FLUFF 4 (GAUZE/BANDAGES/DRESSINGS) IMPLANT
BNDG GZE DERMACEA 4 6PLY (GAUZE/BANDAGES/DRESSINGS) ×1
CORD BIPOLAR FORCEPS 12FT (ELECTRODE) IMPLANT
COVER SURGICAL LIGHT HANDLE (MISCELLANEOUS) ×4 IMPLANT
CUFF TOURN SGL QUICK 18X4 (TOURNIQUET CUFF) ×2 IMPLANT
CUFF TOURN SGL QUICK 24 (TOURNIQUET CUFF)
CUFF TRNQT CYL 24X4X16.5-23 (TOURNIQUET CUFF) IMPLANT
DRAPE C-ARM MINI 42X72 WSTRAPS (DRAPES) ×2 IMPLANT
DRAPE U-SHAPE 47X51 STRL (DRAPES) ×2 IMPLANT
DRSG ADAPTIC 3X8 NADH LF (GAUZE/BANDAGES/DRESSINGS) IMPLANT
DRSG XEROFORM 1X8 (GAUZE/BANDAGES/DRESSINGS) IMPLANT
ELECT REM PT RETURN 9FT ADLT (ELECTROSURGICAL) ×1
ELECTRODE REM PT RTRN 9FT ADLT (ELECTROSURGICAL) ×2 IMPLANT
GAUZE SPONGE 4X4 12PLY STRL (GAUZE/BANDAGES/DRESSINGS) IMPLANT
GAUZE SPONGE 4X4 12PLY STRL LF (GAUZE/BANDAGES/DRESSINGS) IMPLANT
GAUZE XEROFORM 1X8 LF (GAUZE/BANDAGES/DRESSINGS) ×2 IMPLANT
GLOVE BIOGEL PI IND STRL 6.5 (GLOVE) ×2 IMPLANT
GLOVE BIOGEL PI IND STRL 8 (GLOVE) ×2 IMPLANT
GLOVE ECLIPSE 6.0 STRL STRAW (GLOVE) ×2 IMPLANT
GLOVE INDICATOR 8.0 STRL GRN (GLOVE) ×2 IMPLANT
GOWN STRL REUS W/ TWL LRG LVL3 (GOWN DISPOSABLE) ×4 IMPLANT
GOWN STRL REUS W/TWL LRG LVL3 (GOWN DISPOSABLE) ×2
KIT BASIN OR (CUSTOM PROCEDURE TRAY) ×2 IMPLANT
KIT TURNOVER KIT B (KITS) ×2 IMPLANT
NDL HYPO 25GX1X1/2 BEV (NEEDLE) IMPLANT
NEEDLE HYPO 25GX1X1/2 BEV (NEEDLE) IMPLANT
NS IRRIG 1000ML POUR BTL (IV SOLUTION) ×2 IMPLANT
PACK ORTHO EXTREMITY (CUSTOM PROCEDURE TRAY) ×2 IMPLANT
PAD ARMBOARD 7.5X6 YLW CONV (MISCELLANEOUS) ×4 IMPLANT
PAD CAST 3X4 CTTN HI CHSV (CAST SUPPLIES) IMPLANT
PAD CAST 4YDX4 CTTN HI CHSV (CAST SUPPLIES) IMPLANT
PADDING CAST COTTON 3X4 STRL (CAST SUPPLIES) ×3
PADDING CAST COTTON 4X4 STRL (CAST SUPPLIES)
SLING ARM FOAM STRAP LRG (SOFTGOODS) ×2 IMPLANT
SLING ARM FOAM STRAP XLG (SOFTGOODS) ×2 IMPLANT
SPLINT FIBERGLASS 4X30 (CAST SUPPLIES) IMPLANT
SUCTION FRAZIER HANDLE 10FR (MISCELLANEOUS)
SUCTION TUBE FRAZIER 10FR DISP (MISCELLANEOUS) IMPLANT
SUT ETHILON 3 0 PS 1 (SUTURE) ×2 IMPLANT
SUT MNCRL AB 3-0 PS2 18 (SUTURE) ×2 IMPLANT
SYS FBRTK BUTTON 2.6 (Anchor) ×2 IMPLANT
SYSTEM FBRTK BUTTON 2.6 (Anchor) IMPLANT
TOWEL GREEN STERILE (TOWEL DISPOSABLE) ×2 IMPLANT
TOWEL GREEN STERILE FF (TOWEL DISPOSABLE) ×2 IMPLANT
TUBE CONNECTING 12X1/4 (SUCTIONS) IMPLANT
WATER STERILE IRR 1000ML POUR (IV SOLUTION) ×2 IMPLANT

## 2022-03-14 NOTE — Anesthesia Preprocedure Evaluation (Addendum)
Anesthesia Evaluation  Patient identified by MRN, date of birth, ID band Patient awake    Reviewed: Allergy & Precautions, NPO status , Patient's Chart, lab work & pertinent test results  History of Anesthesia Complications Negative for: history of anesthetic complications  Airway Mallampati: II  TM Distance: >3 FB Neck ROM: Full    Dental  (+) Dental Advisory Given, Caps, Implants   Pulmonary asthma , sleep apnea and Continuous Positive Airway Pressure Ventilation , COPD,  COPD inhaler, former smoker   breath sounds clear to auscultation       Cardiovascular negative cardio ROS  Rhythm:Regular Rate:Normal     Neuro/Psych  Headaches  Anxiety        GI/Hepatic negative GI ROS, Neg liver ROS,,,  Endo/Other  BMI 32.8  Renal/GU negative Renal ROS     Musculoskeletal   Abdominal  (+) + obese  Peds  Hematology negative hematology ROS (+)   Anesthesia Other Findings   Reproductive/Obstetrics                             Anesthesia Physical Anesthesia Plan  ASA: 3  Anesthesia Plan: General   Post-op Pain Management: Regional block* and Tylenol PO (pre-op)*   Induction: Intravenous  PONV Risk Score and Plan: 2 and Ondansetron and Dexamethasone  Airway Management Planned: Oral ETT  Additional Equipment: None  Intra-op Plan:   Post-operative Plan: Extubation in OR  Informed Consent: I have reviewed the patients History and Physical, chart, labs and discussed the procedure including the risks, benefits and alternatives for the proposed anesthesia with the patient or authorized representative who has indicated his/her understanding and acceptance.     Dental advisory given  Plan Discussed with: CRNA and Surgeon  Anesthesia Plan Comments: (Plan routine monitors, GETA with supraclavicular block for post op analgesia)        Anesthesia Quick Evaluation

## 2022-03-14 NOTE — Anesthesia Procedure Notes (Signed)
Procedure Name: Intubation Date/Time: 03/14/2022 4:20 PM  Performed by: Minerva Ends, CRNAPre-anesthesia Checklist: Patient identified, Emergency Drugs available, Suction available and Patient being monitored Patient Re-evaluated:Patient Re-evaluated prior to induction Oxygen Delivery Method: Circle system utilized Preoxygenation: Pre-oxygenation with 100% oxygen Induction Type: IV induction Ventilation: Mask ventilation without difficulty Laryngoscope Size: Mac and 3 Grade View: Grade II Tube type: Oral Tube size: 7.0 mm Number of attempts: 1 Airway Equipment and Method: Stylet and Oral airway Placement Confirmation: ETT inserted through vocal cords under direct vision, positive ETCO2 and breath sounds checked- equal and bilateral Secured at: 23 cm Tube secured with: Tape Dental Injury: Teeth and Oropharynx as per pre-operative assessment

## 2022-03-14 NOTE — Discharge Instructions (Signed)
Audria Nine, M.D. Hand Surgery  POST-OPERATIVE DISCHARGE INSTRUCTIONS   PRESCRIPTIONS: You may have been given a prescription to be taken as directed for post-operative pain control.  You may also take over the counter ibuprofen/aleve and tylenol for pain. Take this as directed on the packaging. Do not exceed 3000 mg tylenol/acetaminophen in 24 hours.  Ibuprofen 600-800 mg (3-4) tablets by mouth every 6 hours as needed for pain.  OR Aleve 2 tablets by mouth every 12 hours (twice daily) as needed for pain.  AND/OR Tylenol 1000 mg (2 tablets) every 8 hours as needed for pain.  Please use your pain medication carefully, as refills are limited and you may not be provided with one.  As stated above, please use over the counter pain medicine - it will also be helpful with decreasing your swelling.    ANESTHESIA: After your surgery, post-surgical discomfort or pain is likely. This discomfort can last several days to a few weeks. At certain times of the day your discomfort may be more intense.   Did you receive a nerve block?  A nerve block can provide pain relief for one hour to two days after your surgery. As long as the nerve block is working, you will experience little or no sensation in the area the surgeon operated on.  As the nerve block wears off, you will begin to experience pain or discomfort. It is very important that you begin taking your prescribed pain medication before the nerve block fully wears off. Treating your pain at the first sign of the block wearing off will ensure your pain is better controlled and more tolerable when full-sensation returns. Do not wait until the pain is intolerable, as the medicine will be less effective. It is better to treat pain in advance than to try and catch up.   General Anesthesia:  If you did not receive a nerve block during your surgery, you will need to start taking your pain medication shortly after your surgery and should continue  to do so as prescribed by your surgeon.     ICE AND ELEVATION: You may use ice for the first 48-72 hours, but it is not critical.   Motion of your fingers is very important to decrease the swelling.  Elevation, as much as possible for the next 48 hours, is critical for decreasing swelling as well as for pain relief. Elevation means when you are seated or lying down, you hand should be at or above your heart. When walking, the hand needs to be at or above the level of your elbow.  If the bandage gets too tight, it may need to be loosened. Please contact our office and we will instruct you in how to do this.    SURGICAL BANDAGES:  Keep your dressing and/or splint clean and dry at all times.  Do not remove until you are seen again in the office.  If careful, you may place a plastic bag over your bandage and tape the end to shower, but be careful, do not get your bandages wet.     HAND THERAPY:  You may not need any. If you do, we will begin this at your follow up visit in the clinic.    ACTIVITY AND WORK: You are encouraged to move any fingers which are not in the bandage.  Light use of the fingers is allowed to assist the other hand with daily hygiene and eating, but strong gripping or lifting is often uncomfortable and  should be avoided.  You might miss a variable period of time from work and hopefully this issue has been discussed prior to surgery. You may not do any heavy work with your affected hand for about 2 weeks.    [facility] Second Floor, [address],  [place] [address] [phone]

## 2022-03-14 NOTE — Transfer of Care (Signed)
Immediate Anesthesia Transfer of Care Note  Patient: Gilbert Perkins  Procedure(s) Performed: DISTAL BICEPS TENDON REPAIR (Right)  Patient Location: PACU  Anesthesia Type:GA combined with regional for post-op pain  Level of Consciousness: awake, alert , and oriented  Airway & Oxygen Therapy: Patient Spontanous Breathing  Post-op Assessment: Report given to RN and Post -op Vital signs reviewed and stable  Post vital signs: Reviewed and stable  Last Vitals:  Vitals Value Taken Time  BP 134/95 03/14/22 1800  Temp 98   Pulse 98 03/14/22 1801  Resp 12 03/14/22 1801  SpO2 96 % 03/14/22 1801  Vitals shown include unvalidated device data.  Last Pain:  Vitals:   03/14/22 1600  TempSrc:   PainSc: 0-No pain      Patients Stated Pain Goal: 0 (43/83/77 9396)  Complications: No notable events documented.
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
## 2022-03-14 NOTE — Brief Op Note (Signed)
03/14/2022  5:55 PM  PATIENT:  Gilbert Perkins  51 y.o. male  PRE-OPERATIVE DIAGNOSIS:  Right distal biceps rupture  POST-OPERATIVE DIAGNOSIS:  Right distal biceps rupture  PROCEDURE:  Procedure(s) with comments: DISTAL BICEPS TENDON REPAIR (Right) - or MAC with regional  90  SURGEON:  Surgeon(s) and Role:    * Sherilyn Cooter, MD - Primary    * Vanetta Mulders, MD  PHYSICIAN ASSISTANT:   ASSISTANTS: none   ANESTHESIA:   regional and MAC  EBL:  20 mL   BLOOD ADMINISTERED:none  DRAINS: none   LOCAL MEDICATIONS USED:  NONE  SPECIMEN:  No Specimen  DISPOSITION OF SPECIMEN:  N/A  COUNTS:  YES  TOURNIQUET:  * No tourniquets in log *  DICTATION: .Dragon Dictation  PLAN OF CARE: Discharge to home after PACU  PATIENT DISPOSITION:  PACU - hemodynamically stable.   Delay start of Pharmacological VTE agent (>24hrs) due to surgical blood loss or risk of bleeding: not applicable

## 2022-03-14 NOTE — Anesthesia Procedure Notes (Signed)
Anesthesia Regional Block: Supraclavicular block   Pre-Anesthetic Checklist: , timeout performed,  Correct Patient, Correct Site, Correct Laterality,  Correct Procedure, Correct Position, site marked,  Risks and benefits discussed,  Surgical consent,  Pre-op evaluation,  At surgeon's request and post-op pain management  Laterality: Right and Upper  Prep: chloraprep       Needles:  Injection technique: Single-shot  Needle Type: Echogenic Needle     Needle Length: 9cm  Needle Gauge: 21     Additional Needles:   Procedures:,,,, ultrasound used (permanent image in chart),,    Narrative:  Start time: 03/14/2022 3:57 PM End time: 03/14/2022 4:03 PM Injection made incrementally with aspirations every 5 mL.  Performed by: Personally  Anesthesiologist: Annye Asa, MD  Additional Notes: Pt identified in Holding room.  Monitors applied. Working IV access confirmed. Sterile prep R clavicle and neck.  #21ga ECHOgenic Arrow block needle to supraclavicular brachial plexus with US guidance.  10cc 0.5% Bupivacaine 1:200k epi, Exparel injected incrementally after negative test dose.  Patient asymptomatic, VSS, no heme aspirated, tolerated well.   Jenita Seashore, MD

## 2022-03-15 ENCOUNTER — Other Ambulatory Visit: Payer: Self-pay

## 2022-03-15 ENCOUNTER — Encounter (HOSPITAL_COMMUNITY): Payer: Self-pay | Admitting: Orthopedic Surgery

## 2022-03-15 NOTE — Anesthesia Postprocedure Evaluation (Signed)
Anesthesia Post Note  Patient: Gilbert Perkins  Procedure(s) Performed: DISTAL BICEPS TENDON REPAIR (Right)     Patient location during evaluation: PACU Anesthesia Type: General Level of consciousness: awake and alert Pain management: pain level controlled Vital Signs Assessment: post-procedure vital signs reviewed and stable Respiratory status: spontaneous breathing, nonlabored ventilation, respiratory function stable and patient connected to nasal cannula oxygen Cardiovascular status: blood pressure returned to baseline and stable Postop Assessment: no apparent nausea or vomiting Anesthetic complications: no   No notable events documented.  Last Vitals:  Vitals:   03/14/22 2035 03/14/22 2040  BP:    Pulse: 93 98  Resp: 17 14  Temp:    SpO2: 92% 91%    Last Pain:  Vitals:   03/14/22 2035  TempSrc:   PainSc: 0-No pain                 Santa Lighter

## 2022-03-22 NOTE — Op Note (Signed)
Date of Surgery: 03/22/2022  INDICATIONS: Patient is a 51 y.o.-year-old male with a right distal biceps rupture that was confirmed on MRI.  Risks, benefits, and alternatives to surgery were again discussed with the patient in the preoperative area. The patient wishes to proceed with surgery.  Informed consent was signed after our discussion.   PREOPERATIVE DIAGNOSIS:  Right distal biceps rupture  POSTOPERATIVE DIAGNOSIS: Same.  PROCEDURE:  Right distal biceps repair   SURGEON: Audria Nine, M.D.  ASSIST: Vanetta Mulders, M.D.  ANESTHESIA:  Regional + MAC  IV FLUIDS AND URINE: See anesthesia.  ESTIMATED BLOOD LOSS: 10 mL.  IMPLANTS:  Implant Name Type Inv. Item Serial No. Manufacturer Lot No. LRB No. Used Action  SYS FBRTK BUTTON 2.6 - [id number] Anchor SYS FBRTK BUTTON 2.6  [employer] [id number] Right 2 Implanted     DRAINS: None  COMPLICATIONS: None  DESCRIPTION OF PROCEDURE: The patient was met in the preoperative holding area where the surgical site was marked and the consent form was signed.  The patient was then taken to the operating room and transferred to the operating table.  All bony prominences were well padded.  Monitored sedation  was induced.  The operative extremity was prepped and draped in the usual and sterile fashion.  A formal time-out was performed to confirm that this was the correct patient, surgery, side, and site.  Following formal timeout, a incision was designed over the volar aspect of the forearm directly over the bicipital tuberosity.  The hand was held in a supinated position.  The skin was incised.  Blunt dissection was used to develop the subcutaneous plane.  Small crossing vessels were coagulated with bipolar cautery.  Blunt dissection identified the biceps tendon which was edematous and bruised but appeared to be intact.  The tendon was followed deep into the wound towards the bicipital tuberosity.  There was a an arcade of crossing vessels  which were coagulated and retracted.  The supinator muscle was identified overlying the biceps tuberosity.  This was swept off of the tuberosity using a freer elevator.  The biceps tendon was found to be almost completely ruptured with a small portion of the tendon remaining adherent with the bursal tissue.  The tendon was freed up.  2 Arthrex whip stitches were used to secure the tendon.  The tuberosity was then prepared for the anchors.  Careful retraction was performed both medially and laterally to expose the tuberosity.  Fluoroscopy was brought in which confirmed that we are in the appropriate position.  The first fiber tack anchor was then placed approximately with a second anchor being placed distally.  The anchors were drilled bicortically but the anchor was set against the near cortex.  The previously placed whipstitch were passed through the anchors using the looped sutures through the anchor.  The elbow was then held in a slightly flexed position.  One limb of the sutures going through the proximal anchor was tensioned which brought the biceps tendon flush against the tuberosity at its insertion point.  The 2 limbs of the suture anchor were then tied to secure the fixation.  A similar process repair was repeated for the more distal anchor.  One limb of the anchor was tensioned to serve as the post which brought the distal aspects of the biceps tendon adjacent to its insertion.  This was tied in a similar fashion.  Her biceps tendon repair appeared to be appropriately positioned against the bicipital tuberosity without any evidence of gapping.  The wound was then thoroughly again with copious sterile saline.  It was closed using a combination of 3-0 Monocryl and 4-0 nylon sutures in a horizontal mattress fashion.  The wound was then dressed with Xeroform, folded Kerlix, cast padding, and a well-padded long-arm posterior splint was applied.  The patient was reversed from anesthesia.  They were  transferred from the operating table to the postoperative bed.  All counts were correct x 2 at the end of the procedure.  The patient was then taken to the PACU in stable condition.   POSTOPERATIVE PLAN: He will be discharged to home with appropriate pain medication and discharge instructions.  I'll see him back in 10-14 days for his first postop visit.   Audria Nine, MD 4:25 PM

## 2022-04-21 ENCOUNTER — Other Ambulatory Visit: Payer: Self-pay | Admitting: Internal Medicine

## 2022-04-24 ENCOUNTER — Other Ambulatory Visit: Payer: Self-pay

## 2022-04-24 MED ORDER — MONTELUKAST SODIUM 10 MG PO TABS: 10.0000 mg | ORAL_TABLET | Freq: Every day | ORAL | 0 refills | Status: DC

## 2022-05-07 ENCOUNTER — Other Ambulatory Visit: Payer: Self-pay | Admitting: Allergy and Immunology

## 2022-05-22 ENCOUNTER — Other Ambulatory Visit: Payer: Self-pay | Admitting: Allergy and Immunology

## 2022-05-26 ENCOUNTER — Other Ambulatory Visit: Payer: Self-pay | Admitting: Allergy and Immunology

## 2022-05-26 ENCOUNTER — Other Ambulatory Visit: Payer: Self-pay | Admitting: Internal Medicine

## 2022-05-27 ENCOUNTER — Other Ambulatory Visit: Payer: Self-pay

## 2022-06-30 ENCOUNTER — Other Ambulatory Visit: Payer: Self-pay | Admitting: Allergy and Immunology

## 2022-10-07 ENCOUNTER — Encounter: Payer: Self-pay | Admitting: Internal Medicine

## 2022-10-07 ENCOUNTER — Encounter: Payer: 59 | Admitting: Internal Medicine

## 2022-10-07 NOTE — Progress Notes (Unsigned)
Subjective:    Patient ID: Gilbert Perkins, male    DOB: January 14, 1971, 52 y.o.   MRN: [id number]     HPI Gilbert Perkins is here for a physical exam and his chronic medical problems.   Doing well - no concerns.   Medications and allergies reviewed with patient and updated if appropriate.  Current Outpatient Medications on File Prior to Visit  Medication Sig Dispense Refill   albuterol (PROVENTIL HFA) 108 (90 Base) MCG/ACT inhaler INHALE 2 PUFFS INTO THE LUNGS EVERY 6 HOURS AS NEEDED FOR WHEEZING OR SHORTNESS OF BREATH 6.7 g 11   fluticasone (FLONASE) 50 MCG/ACT nasal spray 2 puffs each nostril once daily (Patient taking differently: Place 2 sprays into both nostrils daily as needed for allergies.) 16 g 12   ibuprofen (ADVIL) 200 MG tablet Take 400-600 mg by mouth every 6 (six) hours as needed for moderate pain.     loratadine (CLARITIN) 10 MG tablet Take 10 mg by mouth daily as needed for allergies.     montelukast (SINGULAIR) 10 MG tablet Take 1 tablet (10 mg total) by mouth at bedtime. 30 tablet 0   Multiple Vitamin (MULTIVITAMIN WITH MINERALS) TABS tablet Take 1 tablet by mouth daily.     naproxen sodium (ALEVE) 220 MG tablet Take 220 mg by mouth daily as needed (pain).     Olopatadine HCl (PATADAY) 0.2 % SOLN Place 1 drop into both eyes 1 day or 1 dose. 2.5 mL 5   oxymetazoline (AFRIN NASAL SPRAY) 0.05 % nasal spray Place 1 spray into both nostrils at bedtime. One nostril every night (Patient taking differently: Place 1 spray into both nostrils at bedtime as needed for congestion.) 30 mL 5   No current facility-administered medications on file prior to visit.    Review of Systems  Constitutional:  Negative for fever.  Eyes:  Negative for visual disturbance.  Respiratory:  Positive for cough (occ). Negative for shortness of breath and wheezing.   Cardiovascular:  Positive for leg swelling (standing long periods). Negative for chest pain and palpitations.  Gastrointestinal:  Negative  for abdominal pain, blood in stool, constipation and diarrhea.       No gerd  Genitourinary:  Positive for difficulty urinating. Negative for dysuria and hematuria.  Musculoskeletal:  Positive for arthralgias (mild). Negative for back pain.  Skin:  Negative for rash.  Neurological:  Positive for headaches (occ). Negative for light-headedness.  Psychiatric/Behavioral:  Negative for dysphoric mood. The patient is not nervous/anxious.        Objective:   Vitals:   10/08/22 0814  BP: 126/72  Pulse: 94  Temp: 98.3 F (36.8 C)  SpO2: 97%   Filed Weights   10/08/22 0814  Weight: 256 lb (116.1 kg)   Body mass index is 35.7 kg/m.  BP Readings from Last 3 Encounters:  10/08/22 126/72  03/14/22 (!) 151/92  02/24/22 132/82    Wt Readings from Last 3 Encounters:  10/08/22 256 lb (116.1 kg)  03/14/22 235 lb (106.6 kg)  10/03/21 245 lb (111.1 kg)      Physical Exam Constitutional: He appears well-developed and well-nourished. No distress.  HENT:  Head: Normocephalic and atraumatic.  Right Ear: External ear normal.  Left Ear: External ear normal.  Normal ear canals and TM b/l  Mouth/Throat: Oropharynx is clear and moist. Eyes: Conjunctivae and EOM are normal.  Neck: Neck supple. No tracheal deviation present. No thyromegaly present.  No carotid bruit  Cardiovascular: Normal rate, regular rhythm, normal  heart sounds and intact distal pulses.   No murmur heard.  No lower extremity edema. Pulmonary/Chest: Effort normal and breath sounds normal. No respiratory distress. He has no wheezes. He has no rales.  Abdominal: Soft. He exhibits no distension. There is no tenderness.  Genitourinary: deferred  Lymphadenopathy:   He has no cervical adenopathy.  Skin: Skin is warm and dry. He is not diaphoretic.  Psychiatric: He has a normal mood and affect. His behavior is normal.         Assessment & Plan:   Physical exam: Screening blood work  ordered Exercise   bike 3 hrs a  week Weight  obese but not representative of build Substance abuse   none   Reviewed recommended immunizations.   Health Maintenance  Topic Date Due   DTaP/Tdap/Td (2 - Tdap) 05/06/2017   Zoster Vaccines- Shingrix (1 of 2) Never done   COVID-19 Vaccine (1) 10/24/2022 (Originally 12/26/1970)   INFLUENZA VACCINE  12/05/2022   Colonoscopy  10/06/2025   Hepatitis C Screening  Completed   HIV Screening  Completed   HPV VACCINES  Aged Out     See Problem List for Assessment and Plan of chronic medical problems.

## 2022-10-07 NOTE — Patient Instructions (Addendum)
An EKG was done today   Blood work was ordered.   The lab is on the first floor.    Medications changes include :   None     Return in about 1 year (around 10/08/2023) for Physical Exam.    Health Maintenance, Male Adopting a healthy lifestyle and getting preventive care are important in promoting health and wellness. Ask your health care provider about: The right schedule for you to have regular tests and exams. Things you can do on your own to prevent diseases and keep yourself healthy. What should I know about diet, weight, and exercise? Eat a healthy diet  Eat a diet that includes plenty of vegetables, fruits, low-fat dairy products, and lean protein. Do not eat a lot of foods that are high in solid fats, added sugars, or sodium. Maintain a healthy weight Body mass index (BMI) is a measurement that can be used to identify possible weight problems. It estimates body fat based on height and weight. Your health care provider can help determine your BMI and help you achieve or maintain a healthy weight. Get regular exercise Get regular exercise. This is one of the most important things you can do for your health. Most adults should: Exercise for at least 150 minutes each week. The exercise should increase your heart rate and make you sweat (moderate-intensity exercise). Do strengthening exercises at least twice a week. This is in addition to the moderate-intensity exercise. Spend less time sitting. Even light physical activity can be beneficial. Watch cholesterol and blood lipids Have your blood tested for lipids and cholesterol at 54 years of age, then have this test every 5 years. You may need to have your cholesterol levels checked more often if: Your lipid or cholesterol levels are high. You are older than 54 years of age. You are at high risk for heart disease. What should I know about cancer screening? Many types of cancers can be detected early and may often be  prevented. Depending on your health history and family history, you may need to have cancer screening at various ages. This may include screening for: Colorectal cancer. Prostate cancer. Skin cancer. Lung cancer. What should I know about heart disease, diabetes, and high blood pressure? Blood pressure and heart disease High blood pressure causes heart disease and increases the risk of stroke. This is more likely to develop in people who have high blood pressure readings or are overweight. Talk with your health care provider about your target blood pressure readings. Have your blood pressure checked: Every 3-5 years if you are 31-65 years of age. Every year if you are 61 years old or older. If you are between the ages of 62 and 34 and are a current or former smoker, ask your health care provider if you should have a one-time screening for abdominal aortic aneurysm (AAA). Diabetes Have regular diabetes screenings. This checks your fasting blood sugar level. Have the screening done: Once every three years after age 20 if you are at a normal weight and have a low risk for diabetes. More often and at a younger age if you are overweight or have a high risk for diabetes. What should I know about preventing infection? Hepatitis B If you have a higher risk for hepatitis B, you should be screened for this virus. Talk with your health care provider to find out if you are at risk for hepatitis B infection. Hepatitis C Blood testing is recommended for: Everyone born from [birth year]  through 1965. Anyone with known risk factors for hepatitis C. Sexually transmitted infections (STIs) You should be screened each year for STIs, including gonorrhea and chlamydia, if: You are sexually active and are younger than 54 years of age. You are older than 54 years of age and your health care provider tells you that you are at risk for this type of infection. Your sexual activity has changed since you were last screened,  and you are at increased risk for chlamydia or gonorrhea. Ask your health care provider if you are at risk. Ask your health care provider about whether you are at high risk for HIV. Your health care provider may recommend a prescription medicine to help prevent HIV infection. If you choose to take medicine to prevent HIV, you should first get tested for HIV. You should then be tested every 3 months for as long as you are taking the medicine. Follow these instructions at home: Alcohol use Do not drink alcohol if your health care provider tells you not to drink. If you drink alcohol: Limit how much you have to 0-2 drinks a day. Know how much alcohol is in your drink. In the [place] one drink equals one 12 oz bottle of beer (355 mL), one 5 oz glass of wine (148 mL), or one 1 oz glass of hard liquor (44 mL). Lifestyle Do not use any products that contain nicotine or tobacco. These products include cigarettes, chewing tobacco, and vaping devices, such as e-cigarettes. If you need help quitting, ask your health care provider. Do not use street drugs. Do not share needles. Ask your health care provider for help if you need support or information about quitting drugs. General instructions Schedule regular health, dental, and eye exams. Stay current with your vaccines. Tell your health care provider if: You often feel depressed. You have ever been abused or do not feel safe at home. Summary Adopting a healthy lifestyle and getting preventive care are important in promoting health and wellness. Follow your health care provider's instructions about healthy diet, exercising, and getting tested or screened for diseases. Follow your health care provider's instructions on monitoring your cholesterol and blood pressure. This information is not intended to replace advice given to you by your health care provider. Make sure you discuss any questions you have with your health care provider. Document Revised:  09/11/2020 Document Reviewed: 09/11/2020 Elsevier Patient Education  2024 [employer].

## 2022-10-08 ENCOUNTER — Ambulatory Visit (INDEPENDENT_AMBULATORY_CARE_PROVIDER_SITE_OTHER): Payer: 59 | Admitting: Internal Medicine

## 2022-10-08 VITALS — BP 126/72 | HR 94 | Temp 98.3°F | Ht 71.0 in | Wt 256.0 lb

## 2022-10-08 DIAGNOSIS — A6000 Herpesviral infection of urogenital system, unspecified: Secondary | ICD-10-CM

## 2022-10-08 DIAGNOSIS — E782 Mixed hyperlipidemia: Secondary | ICD-10-CM | POA: Diagnosis not present

## 2022-10-08 DIAGNOSIS — R7303 Prediabetes: Secondary | ICD-10-CM

## 2022-10-08 DIAGNOSIS — Z0000 Encounter for general adult medical examination without abnormal findings: Secondary | ICD-10-CM

## 2022-10-08 DIAGNOSIS — Z125 Encounter for screening for malignant neoplasm of prostate: Secondary | ICD-10-CM

## 2022-10-08 DIAGNOSIS — N529 Male erectile dysfunction, unspecified: Secondary | ICD-10-CM

## 2022-10-08 DIAGNOSIS — J4520 Mild intermittent asthma, uncomplicated: Secondary | ICD-10-CM

## 2022-10-08 DIAGNOSIS — F419 Anxiety disorder, unspecified: Secondary | ICD-10-CM

## 2022-10-08 LAB — COMPREHENSIVE METABOLIC PANEL
ALT: 30 U/L (ref 0–53)
AST: 35 U/L (ref 0–37)
Albumin: 4.3 g/dL (ref 3.5–5.2)
Alkaline Phosphatase: 43 U/L (ref 39–117)
BUN: 15 mg/dL (ref 6–23)
CO2: 28 mEq/L (ref 19–32)
Calcium: 9.5 mg/dL (ref 8.4–10.5)
Chloride: 100 mEq/L (ref 96–112)
Creatinine, Ser: 1.29 mg/dL (ref 0.40–1.50)
GFR: 62.96 mL/min (ref >60.00)
Glucose, Bld: 104 mg/dL — ABNORMAL HIGH (ref 70–99)
Potassium: 4.3 mEq/L (ref 3.5–5.1)
Sodium: 139 mEq/L (ref 135–145)
Total Bilirubin: 0.6 mg/dL (ref 0.2–1.2)
Total Protein: 6.7 g/dL (ref 6.0–8.3)

## 2022-10-08 LAB — CBC WITH DIFFERENTIAL/PLATELET
Basophils Absolute: 0.1 10*3/uL (ref 0.0–0.1)
Basophils Relative: 1.2 % (ref 0.0–3.0)
Eosinophils Absolute: 0.4 10*3/uL (ref 0.0–0.7)
Eosinophils Relative: 5.3 % — ABNORMAL HIGH (ref 0.0–5.0)
HCT: 50.4 % (ref 39.0–52.0)
Hemoglobin: 16.4 g/dL (ref 13.0–17.0)
Lymphocytes Relative: 27 % (ref 12.0–46.0)
Lymphs Abs: 2 10*3/uL (ref 0.7–4.0)
MCHC: 32.6 g/dL (ref 30.0–36.0)
MCV: 86.6 fl (ref 78.0–100.0)
Monocytes Absolute: 0.9 10*3/uL (ref 0.1–1.0)
Monocytes Relative: 12.2 % — ABNORMAL HIGH (ref 3.0–12.0)
Neutro Abs: 4.1 10*3/uL (ref 1.4–7.7)
Neutrophils Relative %: 54.3 % (ref 43.0–77.0)
Platelets: 312 10*3/uL (ref 150.0–400.0)
RBC: 5.83 Mil/uL — ABNORMAL HIGH (ref 4.22–5.81)
RDW: 13.9 % (ref 11.5–15.5)
WBC: 7.6 10*3/uL (ref 4.0–10.5)

## 2022-10-08 LAB — LIPID PANEL
Cholesterol: 123 mg/dL (ref 0–200)
HDL: 25.8 mg/dL — ABNORMAL LOW (ref >39.00)
LDL Cholesterol: 62 mg/dL (ref 0–99)
NonHDL: 97.38
Total CHOL/HDL Ratio: 5
Triglycerides: 175 mg/dL — ABNORMAL HIGH (ref 0.0–149.0)
VLDL: 35 mg/dL (ref 0.0–40.0)

## 2022-10-08 LAB — PSA: PSA: 0.36 ng/mL (ref 0.10–4.00)

## 2022-10-08 LAB — TSH: TSH: 0.96 u[IU]/mL (ref 0.35–5.50)

## 2022-10-08 LAB — HEMOGLOBIN A1C: Hgb A1c MFr Bld: 5.8 % (ref 4.6–6.5)

## 2022-10-08 MED ORDER — ALPRAZOLAM 0.25 MG PO TABS: 0.2500 mg | ORAL_TABLET | Freq: Every day | ORAL | 1 refills | Status: DC

## 2022-10-08 MED ORDER — ACYCLOVIR 400 MG PO TABS: 400.0000 mg | ORAL_TABLET | Freq: Every day | ORAL | 3 refills | Status: DC

## 2022-10-08 MED ORDER — SILDENAFIL CITRATE 20 MG PO TABS: ORAL_TABLET | ORAL | 3 refills | Status: DC

## 2022-10-08 MED ORDER — ROSUVASTATIN CALCIUM 10 MG PO TABS: 10.0000 mg | ORAL_TABLET | Freq: Every day | ORAL | 3 refills | Status: DC

## 2022-10-08 NOTE — Assessment & Plan Note (Signed)
Takes acyclovir as needed Continue acyclovir 400 mg 3 times daily as needed

## 2022-10-08 NOTE — Assessment & Plan Note (Signed)
Chronic Management per Dr. Kozlow Taking montelukast 10 mg nightly Continue albuterol inhaler as needed 

## 2022-10-08 NOTE — Assessment & Plan Note (Signed)
Chronic Controlled, Stable Continue Xanax 0.25 mg daily at bedtime as needed

## 2022-10-08 NOTE — Assessment & Plan Note (Signed)
Chronic Start sildenafil 40-100 mg daily as needed

## 2022-10-08 NOTE — Assessment & Plan Note (Addendum)
Chronic Regular exercise and healthy diet encouraged Check lipid panel, CMP, CBC, TSH Continue Crestor 10 mg daily  EKG: NSR at 90 bpm, possible LAE.  No change compared to prior EKG from 2012

## 2022-10-08 NOTE — Assessment & Plan Note (Signed)
Chronic Check a1c Low sugar / carb diet Stressed regular exercise  

## 2022-12-08 ENCOUNTER — Other Ambulatory Visit: Payer: Self-pay | Admitting: Internal Medicine

## 2023-01-03 ENCOUNTER — Emergency Department (HOSPITAL_BASED_OUTPATIENT_CLINIC_OR_DEPARTMENT_OTHER)
Admission: EM | Admit: 2023-01-03 | Discharge: 2023-01-03 | Discharge status: Against Medical Advice | Payer: 59 | Attending: Emergency Medicine | Admitting: Emergency Medicine

## 2023-01-03 ENCOUNTER — Other Ambulatory Visit: Payer: Self-pay

## 2023-01-03 ENCOUNTER — Encounter (HOSPITAL_BASED_OUTPATIENT_CLINIC_OR_DEPARTMENT_OTHER): Payer: Self-pay | Admitting: Emergency Medicine

## 2023-01-03 DIAGNOSIS — Z5321 Procedure and treatment not carried out due to patient leaving prior to being seen by health care provider: Secondary | ICD-10-CM | POA: Insufficient documentation

## 2023-01-03 DIAGNOSIS — H5789 Other specified disorders of eye and adnexa: Secondary | ICD-10-CM | POA: Diagnosis not present

## 2023-01-03 NOTE — ED Notes (Signed)
Pt notified registration he was LWBS

## 2023-01-03 NOTE — ED Triage Notes (Signed)
Pt in with R eye irritation since waking tonight. Pt states he went to bed with eyes feeling normal, but feels like there is something in his R eye. Denies any trauma or known irritants to eye. Has flushed PTA

## 2023-04-02 ENCOUNTER — Encounter: Payer: Self-pay | Admitting: Urology

## 2023-04-02 ENCOUNTER — Ambulatory Visit: Payer: 59 | Admitting: Urology

## 2023-04-02 VITALS — BP 162/91 | HR 85 | Ht 71.0 in | Wt 219.0 lb

## 2023-04-02 DIAGNOSIS — E291 Testicular hypofunction: Secondary | ICD-10-CM | POA: Insufficient documentation

## 2023-04-02 DIAGNOSIS — R3912 Poor urinary stream: Secondary | ICD-10-CM

## 2023-04-02 DIAGNOSIS — R339 Retention of urine, unspecified: Secondary | ICD-10-CM | POA: Diagnosis not present

## 2023-04-02 DIAGNOSIS — N529 Male erectile dysfunction, unspecified: Secondary | ICD-10-CM | POA: Diagnosis not present

## 2023-04-02 LAB — URINALYSIS, ROUTINE W REFLEX MICROSCOPIC
Bilirubin, UA: NEGATIVE
Glucose, UA: NEGATIVE
Ketones, UA: NEGATIVE
Leukocytes,UA: NEGATIVE
Nitrite, UA: NEGATIVE
RBC, UA: NEGATIVE
Specific Gravity, UA: 1.02 (ref 1.005–1.030)
Urobilinogen, Ur: 0.2 mg/dL (ref 0.2–1.0)
pH, UA: 7 (ref 5.0–7.5)

## 2023-04-02 LAB — MICROSCOPIC EXAMINATION

## 2023-04-02 LAB — BLADDER SCAN AMB NON-IMAGING

## 2023-04-02 MED ORDER — ALFUZOSIN HCL ER 10 MG PO TB24
10.0000 mg | ORAL_TABLET | Freq: Every day | ORAL | 11 refills | Status: DC
Start: 2023-04-02 — End: 2023-05-22
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
## 2023-04-03 ENCOUNTER — Encounter: Payer: Self-pay | Admitting: Urology

## 2023-05-06 ENCOUNTER — Ambulatory Visit: Payer: 59 | Admitting: Urology

## 2023-05-22 ENCOUNTER — Ambulatory Visit: Payer: 59 | Admitting: Urology

## 2023-05-22 ENCOUNTER — Encounter: Payer: Self-pay | Admitting: Urology

## 2023-05-22 VITALS — BP 162/96 | HR 97 | Ht 71.0 in | Wt 218.0 lb

## 2023-05-22 DIAGNOSIS — R339 Retention of urine, unspecified: Secondary | ICD-10-CM | POA: Diagnosis not present

## 2023-05-22 DIAGNOSIS — N529 Male erectile dysfunction, unspecified: Secondary | ICD-10-CM | POA: Diagnosis not present

## 2023-05-22 DIAGNOSIS — R3912 Poor urinary stream: Secondary | ICD-10-CM

## 2023-05-22 DIAGNOSIS — E291 Testicular hypofunction: Secondary | ICD-10-CM | POA: Diagnosis not present

## 2023-05-22 LAB — URINALYSIS, ROUTINE W REFLEX MICROSCOPIC
Bilirubin, UA: NEGATIVE
Glucose, UA: NEGATIVE
Ketones, UA: NEGATIVE
Leukocytes,UA: NEGATIVE
Nitrite, UA: NEGATIVE
Protein,UA: NEGATIVE
RBC, UA: NEGATIVE
Specific Gravity, UA: 1.02 (ref 1.005–1.030)
Urobilinogen, Ur: 0.2 mg/dL (ref 0.2–1.0)
pH, UA: 7 (ref 5.0–7.5)

## 2023-05-22 LAB — BLADDER SCAN AMB NON-IMAGING

## 2023-05-22 MED ORDER — SILODOSIN 8 MG PO CAPS
8.0000 mg | ORAL_CAPSULE | Freq: Every day | ORAL | 11 refills | Status: DC
Start: 2023-05-22 — End: 2024-01-20
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
## 2023-08-13 ENCOUNTER — Other Ambulatory Visit: Payer: Self-pay | Admitting: Internal Medicine

## 2023-10-16 ENCOUNTER — Other Ambulatory Visit: Payer: Self-pay | Admitting: Internal Medicine

## 2023-11-03 ENCOUNTER — Other Ambulatory Visit: Payer: Self-pay | Admitting: Internal Medicine

## 2023-11-03 ENCOUNTER — Other Ambulatory Visit: Payer: Self-pay | Admitting: Allergy and Immunology

## 2023-11-10 ENCOUNTER — Other Ambulatory Visit: Payer: Self-pay

## 2023-11-10 DIAGNOSIS — R7303 Prediabetes: Secondary | ICD-10-CM

## 2023-11-10 DIAGNOSIS — Z0000 Encounter for general adult medical examination without abnormal findings: Secondary | ICD-10-CM

## 2023-11-10 DIAGNOSIS — Z125 Encounter for screening for malignant neoplasm of prostate: Secondary | ICD-10-CM

## 2023-11-10 DIAGNOSIS — E782 Mixed hyperlipidemia: Secondary | ICD-10-CM

## 2023-11-20 ENCOUNTER — Other Ambulatory Visit

## 2023-11-20 DIAGNOSIS — E782 Mixed hyperlipidemia: Secondary | ICD-10-CM | POA: Diagnosis not present

## 2023-11-20 DIAGNOSIS — Z125 Encounter for screening for malignant neoplasm of prostate: Secondary | ICD-10-CM

## 2023-11-20 DIAGNOSIS — R7303 Prediabetes: Secondary | ICD-10-CM | POA: Diagnosis not present

## 2023-11-20 LAB — COMPREHENSIVE METABOLIC PANEL WITH GFR
ALT: 32 U/L (ref 0–53)
AST: 28 U/L (ref 0–37)
Albumin: 4.5 g/dL (ref 3.5–5.2)
Alkaline Phosphatase: 57 U/L (ref 39–117)
BUN: 11 mg/dL (ref 6–23)
CO2: 31 meq/L (ref 19–32)
Calcium: 9.4 mg/dL (ref 8.4–10.5)
Chloride: 102 meq/L (ref 96–112)
Creatinine, Ser: 1.35 mg/dL (ref 0.40–1.50)
GFR: 59.15 mL/min — ABNORMAL LOW (ref >60.00)
Glucose, Bld: 108 mg/dL — ABNORMAL HIGH (ref 70–99)
Potassium: 4.6 meq/L (ref 3.5–5.1)
Sodium: 139 meq/L (ref 135–145)
Total Bilirubin: 0.4 mg/dL (ref 0.2–1.2)
Total Protein: 6.6 g/dL (ref 6.0–8.3)

## 2023-11-20 LAB — CBC WITH DIFFERENTIAL/PLATELET
Basophils Absolute: 0.1 K/uL (ref 0.0–0.1)
Basophils Relative: 1.4 % (ref 0.0–3.0)
Eosinophils Absolute: 0.5 K/uL (ref 0.0–0.7)
Eosinophils Relative: 7.6 % — ABNORMAL HIGH (ref 0.0–5.0)
HCT: 54.4 % — ABNORMAL HIGH (ref 39.0–52.0)
Hemoglobin: 17.9 g/dL — ABNORMAL HIGH (ref 13.0–17.0)
Lymphocytes Relative: 25.6 % (ref 12.0–46.0)
Lymphs Abs: 1.6 K/uL (ref 0.7–4.0)
MCHC: 32.9 g/dL (ref 30.0–36.0)
MCV: 86 fl (ref 78.0–100.0)
Monocytes Absolute: 0.6 K/uL (ref 0.1–1.0)
Monocytes Relative: 10 % (ref 3.0–12.0)
Neutro Abs: 3.5 K/uL (ref 1.4–7.7)
Neutrophils Relative %: 55.4 % (ref 43.0–77.0)
Platelets: 269 K/uL (ref 150.0–400.0)
RBC: 6.33 Mil/uL — ABNORMAL HIGH (ref 4.22–5.81)
RDW: 15.4 % (ref 11.5–15.5)
WBC: 6.3 K/uL (ref 4.0–10.5)

## 2023-11-20 LAB — LIPID PANEL
Cholesterol: 132 mg/dL (ref 0–200)
HDL: 29 mg/dL — ABNORMAL LOW (ref >39.00)
LDL Cholesterol: 71 mg/dL (ref 0–99)
NonHDL: 103.22
Total CHOL/HDL Ratio: 5
Triglycerides: 159 mg/dL — ABNORMAL HIGH (ref 0.0–149.0)
VLDL: 31.8 mg/dL (ref 0.0–40.0)

## 2023-11-20 LAB — PSA: PSA: 0.8 ng/mL (ref 0.10–4.00)

## 2023-11-20 LAB — TSH: TSH: 1.09 u[IU]/mL (ref 0.35–5.50)

## 2023-11-20 LAB — HEMOGLOBIN A1C: Hgb A1c MFr Bld: 5.9 % (ref 4.6–6.5)

## 2023-11-27 ENCOUNTER — Encounter: Payer: Self-pay | Admitting: Internal Medicine

## 2023-11-27 NOTE — Patient Instructions (Addendum)
 Medications changes include :   None    An Echocardiogram or ultrasound of your heart was ordered and someone will call you to schedule an appointment.     Return in about 1 year (around 11/27/2024) for Physical Exam.    Health Maintenance, Male Adopting a healthy lifestyle and getting preventive care are important in promoting health and wellness. Ask your health care provider about: The right schedule for you to have regular tests and exams. Things you can do on your own to prevent diseases and keep yourself healthy. What should I know about diet, weight, and exercise? Eat a healthy diet  Eat a diet that includes plenty of vegetables, fruits, low-fat dairy products, and lean protein. Do not eat a lot of foods that are high in solid fats, added sugars, or sodium. Maintain a healthy weight Body mass index (BMI) is a measurement that can be used to identify possible weight problems. It estimates body fat based on height and weight. Your health care provider can help determine your BMI and help you achieve or maintain a healthy weight. Get regular exercise Get regular exercise. This is one of the most important things you can do for your health. Most adults should: Exercise for at least 150 minutes each week. The exercise should increase your heart rate and make you sweat (moderate-intensity exercise). Do strengthening exercises at least twice a week. This is in addition to the moderate-intensity exercise. Spend less time sitting. Even light physical activity can be beneficial. Watch cholesterol and blood lipids Have your blood tested for lipids and cholesterol at 55 years of age, then have this test every 5 years. You may need to have your cholesterol levels checked more often if: Your lipid or cholesterol levels are high. You are older than 56 years of age. You are at high risk for heart disease. What should I know about cancer screening? Many types of cancers can be  detected early and may often be prevented. Depending on your health history and family history, you may need to have cancer screening at various ages. This may include screening for: Colorectal cancer. Prostate cancer. Skin cancer. Lung cancer. What should I know about heart disease, diabetes, and high blood pressure? Blood pressure and heart disease High blood pressure causes heart disease and increases the risk of stroke. This is more likely to develop in people who have high blood pressure readings or are overweight. Talk with your health care provider about your target blood pressure readings. Have your blood pressure checked: Every 3-5 years if you are 90-47 years of age. Every year if you are 63 years old or older. If you are between the ages of 58 and 61 and are a current or former smoker, ask your health care provider if you should have a one-time screening for abdominal aortic aneurysm (AAA). Diabetes Have regular diabetes screenings. This checks your fasting blood sugar level. Have the screening done: Once every three years after age 49 if you are at a normal weight and have a low risk for diabetes. More often and at a younger age if you are overweight or have a high risk for diabetes. What should I know about preventing infection? Hepatitis B If you have a higher risk for hepatitis B, you should be screened for this virus. Talk with your health care provider to find out if you are at risk for hepatitis B infection. Hepatitis C Blood testing is recommended for: Everyone born from [birth year]  through 1965. Anyone with known risk factors for hepatitis C. Sexually transmitted infections (STIs) You should be screened each year for STIs, including gonorrhea and chlamydia, if: You are sexually active and are younger than 55 years of age. You are older than 55 years of age and your health care provider tells you that you are at risk for this type of infection. Your sexual activity has changed  since you were last screened, and you are at increased risk for chlamydia or gonorrhea. Ask your health care provider if you are at risk. Ask your health care provider about whether you are at high risk for HIV. Your health care provider may recommend a prescription medicine to help prevent HIV infection. If you choose to take medicine to prevent HIV, you should first get tested for HIV. You should then be tested every 3 months for as long as you are taking the medicine. Follow these instructions at home: Alcohol use Do not drink alcohol if your health care provider tells you not to drink. If you drink alcohol: Limit how much you have to 0-2 drinks a day. Know how much alcohol is in your drink. In the [place] one drink equals one 12 oz bottle of beer (355 mL), one 5 oz glass of wine (148 mL), or one 1 oz glass of hard liquor (44 mL). Lifestyle Do not use any products that contain nicotine or tobacco. These products include cigarettes, chewing tobacco, and vaping devices, such as e-cigarettes. If you need help quitting, ask your health care provider. Do not use street drugs. Do not share needles. Ask your health care provider for help if you need support or information about quitting drugs. General instructions Schedule regular health, dental, and eye exams. Stay current with your vaccines. Tell your health care provider if: You often feel depressed. You have ever been abused or do not feel safe at home. Summary Adopting a healthy lifestyle and getting preventive care are important in promoting health and wellness. Follow your health care provider's instructions about healthy diet, exercising, and getting tested or screened for diseases. Follow your health care provider's instructions on monitoring your cholesterol and blood pressure. This information is not intended to replace advice given to you by your health care provider. Make sure you discuss any questions you have with your health care  provider. Document Revised: 09/11/2020 Document Reviewed: 09/11/2020 Elsevier Patient Education  2024 [employer].

## 2023-11-27 NOTE — Progress Notes (Unsigned)
 Subjective:    Patient ID: Gilbert Perkins, male    DOB: 1970-10-03, 53 y.o.   MRN: [id number]     HPI Ngai is here for a physical exam and his chronic medical problems.   Labs done-elevated H/H   Medications and allergies reviewed with patient and updated if appropriate.  Current Outpatient Medications on File Prior to Visit  Medication Sig Dispense Refill   acyclovir  (ZOVIRAX ) 400 MG tablet TAKE 1 TABLET BY MOUTH DAILY 30 tablet 0   albuterol  (PROVENTIL  HFA) 108 (90 Base) MCG/ACT inhaler INHALE 2 PUFFS INTO THE LUNGS EVERY 6 HOURS AS NEEDED FOR WHEEZING OR SHORTNESS OF BREATH 6.7 g 11   ALPRAZolam  (XANAX ) 0.25 MG tablet TAKE 1 TABLET BY MOUTH AT  BEDTIME 30 tablet 0   fluticasone  (FLONASE ) 50 MCG/ACT nasal spray 2 puffs each nostril once daily (Patient taking differently: Place 2 sprays into both nostrils daily as needed for allergies.) 16 g 12   ibuprofen  (ADVIL ) 200 MG tablet Take 400-600 mg by mouth every 6 (six) hours as needed for moderate pain.     montelukast  (SINGULAIR ) 10 MG tablet TAKE 1 TABLET BY MOUTH AT  BEDTIME 30 tablet 11   Multiple Vitamin (MULTIVITAMIN WITH MINERALS) TABS tablet Take 1 tablet by mouth daily.     naproxen sodium (ALEVE) 220 MG tablet Take 220 mg by mouth daily as needed (pain).     oxymetazoline  (AFRIN NASAL SPRAY) 0.05 % nasal spray Place 1 spray into both nostrils at bedtime. One nostril every night (Patient taking differently: Place 1 spray into both nostrils at bedtime as needed for congestion.) 30 mL 5   rosuvastatin  (CRESTOR ) 10 MG tablet TAKE 1 TABLET BY MOUTH DAILY 90 tablet 3   sildenafil  (REVATIO ) 20 MG tablet TAKE 2 TO 5 TABLETS BY MOUTH  DAILY AS NEEDED 450 tablet 0   silodosin  (RAPAFLO ) 8 MG CAPS capsule Take 1 capsule (8 mg total) by mouth daily. 30 capsule 11   No current facility-administered medications on file prior to visit.    Review of Systems     Objective:  There were no vitals filed for this visit. There were no  vitals filed for this visit. There is no height or weight on file to calculate BMI.  BP Readings from Last 3 Encounters:  05/22/23 (!) 162/96  04/02/23 (!) 162/91  10/08/22 126/72    Wt Readings from Last 3 Encounters:  05/22/23 218 lb (98.9 kg)  04/02/23 219 lb (99.3 kg)  10/08/22 256 lb (116.1 kg)      Physical Exam Constitutional: He appears well-developed and well-nourished. No distress.  HENT:  Head: Normocephalic and atraumatic.  Right Ear: External ear normal.  Left Ear: External ear normal.  Normal ear canals and TM b/l  Mouth/Throat: Oropharynx is clear and moist. Eyes: Conjunctivae and EOM are normal.  Neck: Neck supple. No tracheal deviation present. No thyromegaly present.  No carotid bruit  Cardiovascular: Normal rate, regular rhythm, normal heart sounds and intact distal pulses.   No murmur heard.  No lower extremity edema. Pulmonary/Chest: Effort normal and breath sounds normal. No respiratory distress. He has no wheezes. He has no rales.  Abdominal: Soft. He exhibits no distension. There is no tenderness.  Genitourinary: deferred  Lymphadenopathy:   He has no cervical adenopathy.  Skin: Skin is warm and dry. He is not diaphoretic.  Psychiatric: He has a normal mood and affect. His behavior is normal.  Assessment & Plan:   Physical exam: Screening blood work  ordered Exercise    Weight   Substance abuse   none   Reviewed recommended immunizations.   Health Maintenance  Topic Date Due   Pneumococcal Vaccine 16-22 Years old (1 of 2 - PCV) Never done   Hepatitis B Vaccines (1 of 3 - 19+ 3-dose series) Never done   DTaP/Tdap/Td (2 - Tdap) 05/06/2017   Zoster Vaccines- Shingrix (1 of 2) Never done   COVID-19 Vaccine (1 - 2024-25 season) Never done   INFLUENZA VACCINE  12/05/2023   Colonoscopy  10/06/2025   Hepatitis C Screening  Completed   HIV Screening  Completed   HPV VACCINES  Aged Out   Meningococcal B Vaccine  Aged Out     See  Problem List for Assessment and Plan of chronic medical problems.

## 2023-11-28 ENCOUNTER — Ambulatory Visit (INDEPENDENT_AMBULATORY_CARE_PROVIDER_SITE_OTHER): Admitting: Internal Medicine

## 2023-11-28 VITALS — BP 124/80 | HR 80 | Temp 98.2°F | Ht 71.0 in | Wt 214.0 lb

## 2023-11-28 DIAGNOSIS — E291 Testicular hypofunction: Secondary | ICD-10-CM

## 2023-11-28 DIAGNOSIS — D751 Secondary polycythemia: Secondary | ICD-10-CM

## 2023-11-28 DIAGNOSIS — A6000 Herpesviral infection of urogenital system, unspecified: Secondary | ICD-10-CM | POA: Diagnosis not present

## 2023-11-28 DIAGNOSIS — R011 Cardiac murmur, unspecified: Secondary | ICD-10-CM

## 2023-11-28 DIAGNOSIS — N529 Male erectile dysfunction, unspecified: Secondary | ICD-10-CM

## 2023-11-28 DIAGNOSIS — Z125 Encounter for screening for malignant neoplasm of prostate: Secondary | ICD-10-CM

## 2023-11-28 DIAGNOSIS — J4520 Mild intermittent asthma, uncomplicated: Secondary | ICD-10-CM

## 2023-11-28 DIAGNOSIS — R7303 Prediabetes: Secondary | ICD-10-CM

## 2023-11-28 DIAGNOSIS — Z0000 Encounter for general adult medical examination without abnormal findings: Secondary | ICD-10-CM | POA: Diagnosis not present

## 2023-11-28 DIAGNOSIS — E782 Mixed hyperlipidemia: Secondary | ICD-10-CM

## 2023-11-28 DIAGNOSIS — F419 Anxiety disorder, unspecified: Secondary | ICD-10-CM | POA: Diagnosis not present

## 2023-11-28 MED ORDER — TADALAFIL 20 MG PO TABS: 10.0000 mg | ORAL_TABLET | ORAL | 8 refills | Status: DC | PRN

## 2023-11-28 NOTE — Assessment & Plan Note (Signed)
Chronic Controlled, Stable Continue Xanax 0.25 mg daily at bedtime as needed

## 2023-11-28 NOTE — Assessment & Plan Note (Addendum)
 Chronic On testosterone  replacement, which has made a big difference in how he feels

## 2023-11-28 NOTE — Assessment & Plan Note (Signed)
 Chronic Regular exercise and healthy diet encouraged Lab Results  Component Value Date   LDLCALC 71 11/20/2023   Lipids controlled Continue Crestor  10 mg daily

## 2023-11-28 NOTE — Assessment & Plan Note (Addendum)
 Recent labs with elevated h/h He is on testosterone  replacement which explains the mild polycythemia

## 2023-11-28 NOTE — Assessment & Plan Note (Addendum)
 Chronic Lab Results  Component Value Date   HGBA1C 5.9 11/20/2023   Low sugar / carb diet Continue regular exercise Congratulated on weight loss

## 2023-11-28 NOTE — Assessment & Plan Note (Addendum)
 Chronic Would like to try Cialis  instead of sildenafil  Cialis  10-20 mg daily as needed

## 2023-11-28 NOTE — Assessment & Plan Note (Signed)
Chronic Management per Dr. Kozlow Taking montelukast 10 mg nightly Continue albuterol inhaler as needed 

## 2023-11-28 NOTE — Assessment & Plan Note (Signed)
 Chronic Intermittent flares Takes acyclovir  as needed Continue acyclovir  400 mg 3 times daily as needed

## 2023-12-06 ENCOUNTER — Encounter: Payer: Self-pay | Admitting: Internal Medicine

## 2023-12-09 ENCOUNTER — Other Ambulatory Visit: Payer: Self-pay

## 2023-12-09 DIAGNOSIS — N529 Male erectile dysfunction, unspecified: Secondary | ICD-10-CM

## 2023-12-09 MED ORDER — TADALAFIL 20 MG PO TABS: 10.0000 mg | ORAL_TABLET | ORAL | 8 refills | Status: DC | PRN

## 2023-12-17 ENCOUNTER — Other Ambulatory Visit: Payer: Self-pay

## 2023-12-17 DIAGNOSIS — N529 Male erectile dysfunction, unspecified: Secondary | ICD-10-CM

## 2023-12-17 MED ORDER — ALPRAZOLAM 0.25 MG PO TABS: 0.2500 mg | ORAL_TABLET | Freq: Every day | ORAL | 0 refills | Status: AC

## 2023-12-17 MED ORDER — TADALAFIL 20 MG PO TABS: 10.0000 mg | ORAL_TABLET | ORAL | 8 refills | Status: DC | PRN

## 2023-12-17 MED ORDER — ACYCLOVIR 400 MG PO TABS: 400.0000 mg | ORAL_TABLET | Freq: Every day | ORAL | 5 refills | Status: DC

## 2023-12-26 ENCOUNTER — Other Ambulatory Visit: Payer: Self-pay

## 2023-12-26 DIAGNOSIS — N529 Male erectile dysfunction, unspecified: Secondary | ICD-10-CM

## 2023-12-26 MED ORDER — ACYCLOVIR 400 MG PO TABS: 400.0000 mg | ORAL_TABLET | Freq: Every day | ORAL | 0 refills | Status: DC

## 2023-12-26 MED ORDER — TADALAFIL 20 MG PO TABS: 10.0000 mg | ORAL_TABLET | ORAL | 0 refills | Status: AC | PRN

## 2023-12-29 ENCOUNTER — Ambulatory Visit (HOSPITAL_COMMUNITY)
Admission: RE | Admit: 2023-12-29 | Discharge: 2023-12-29 | Disposition: A | Discharge status: Home / Self Care | Source: Ambulatory Visit | Attending: Internal Medicine | Admitting: Internal Medicine

## 2023-12-29 DIAGNOSIS — R011 Cardiac murmur, unspecified: Secondary | ICD-10-CM | POA: Diagnosis present

## 2023-12-29 MED ORDER — PERFLUTREN LIPID MICROSPHERE
1.0000 mL | INTRAVENOUS | Status: AC | PRN
  Administered 2023-12-29: 2 mL via INTRAVENOUS

## 2023-12-30 LAB — ECHOCARDIOGRAM COMPLETE
AR max vel: 2.07 cm2
AV Area VTI: 1.88 cm2
AV Area mean vel: 1.98 cm2
AV Mean grad: 2 mmHg
AV Peak grad: 3.3 mmHg
Ao pk vel: 0.9 m/s
Area-P 1/2: 3.91 cm2
MV M vel: 2.52 m/s
MV Peak grad: 25.4 mmHg
S' Lateral: 2.76 cm

## 2023-12-31 ENCOUNTER — Ambulatory Visit: Payer: Self-pay | Admitting: Internal Medicine

## 2024-01-20 ENCOUNTER — Other Ambulatory Visit: Payer: Self-pay | Admitting: Urology

## 2024-01-20 ENCOUNTER — Encounter: Payer: Self-pay | Admitting: Urology

## 2024-01-20 DIAGNOSIS — R339 Retention of urine, unspecified: Secondary | ICD-10-CM

## 2024-01-20 MED ORDER — TAMSULOSIN HCL 0.4 MG PO CAPS: 0.4000 mg | ORAL_CAPSULE | Freq: Every day | ORAL | 11 refills | Status: AC

## 2024-03-21 ENCOUNTER — Other Ambulatory Visit: Payer: Self-pay | Admitting: Internal Medicine

## 2024-04-16 ENCOUNTER — Encounter: Payer: Self-pay | Admitting: Internal Medicine

## 2024-04-18 NOTE — Progress Notes (Unsigned)
° ° °  Subjective:    Patient ID: Gilbert Perkins, male    DOB: 1970-07-24, 53 y.o.   MRN: [id number]      HPI Gilbert Perkins is here for No chief complaint on file.        Medications and allergies reviewed with patient and updated if appropriate.  Medications Ordered Prior to Encounter[1]  Review of Systems     Objective:  There were no vitals filed for this visit. BP Readings from Last 3 Encounters:  11/28/23 124/80  05/22/23 (!) 162/96  04/02/23 (!) 162/91   Wt Readings from Last 3 Encounters:  11/28/23 214 lb (97.1 kg)  05/22/23 218 lb (98.9 kg)  04/02/23 219 lb (99.3 kg)   There is no height or weight on file to calculate BMI.    Physical Exam         Assessment & Plan:    See Problem List for Assessment and Plan of chronic medical problems.         [1]  Current Outpatient Medications on File Prior to Visit  Medication Sig Dispense Refill   acyclovir  (ZOVIRAX ) 400 MG tablet TAKE 1 TABLET BY MOUTH EVERY DAY 90 tablet 1   albuterol  (PROVENTIL  HFA) 108 (90 Base) MCG/ACT inhaler INHALE 2 PUFFS INTO THE LUNGS EVERY 6 HOURS AS NEEDED FOR WHEEZING OR SHORTNESS OF BREATH 6.7 g 11   ALPRAZolam  (XANAX ) 0.25 MG tablet Take 1 tablet (0.25 mg total) by mouth at bedtime. 45 tablet 0   fluticasone  (FLONASE ) 50 MCG/ACT nasal spray 2 puffs each nostril once daily (Patient taking differently: Place 2 sprays into both nostrils daily as needed for allergies.) 16 g 12   ibuprofen  (ADVIL ) 200 MG tablet Take 400-600 mg by mouth every 6 (six) hours as needed for moderate pain.     montelukast  (SINGULAIR ) 10 MG tablet TAKE 1 TABLET BY MOUTH AT  BEDTIME 30 tablet 11   Multiple Vitamin (MULTIVITAMIN WITH MINERALS) TABS tablet Take 1 tablet by mouth daily.     naproxen sodium (ALEVE) 220 MG tablet Take 220 mg by mouth daily as needed (pain).     oxymetazoline  (AFRIN NASAL SPRAY) 0.05 % nasal spray Place 1 spray into both nostrils at bedtime. One nostril every night (Patient taking  differently: Place 1 spray into both nostrils at bedtime as needed for congestion.) 30 mL 5   rosuvastatin  (CRESTOR ) 10 MG tablet TAKE 1 TABLET BY MOUTH DAILY 90 tablet 3   tadalafil  (CIALIS ) 20 MG tablet Take 0.5-1 tablets (10-20 mg total) by mouth every other day as needed for erectile dysfunction. 30 tablet 0   tamsulosin  (FLOMAX ) 0.4 MG CAPS capsule Take 1 capsule (0.4 mg total) by mouth daily. 30 capsule 11   No current facility-administered medications on file prior to visit.

## 2024-04-19 ENCOUNTER — Encounter: Payer: Self-pay | Admitting: Internal Medicine

## 2024-04-19 ENCOUNTER — Ambulatory Visit: Admitting: Internal Medicine

## 2024-04-19 VITALS — BP 160/90 | HR 81 | Temp 98.7°F | Ht 71.0 in

## 2024-04-19 DIAGNOSIS — I10 Essential (primary) hypertension: Secondary | ICD-10-CM | POA: Insufficient documentation

## 2024-04-19 MED ORDER — VALSARTAN-HYDROCHLOROTHIAZIDE 80-12.5 MG PO TABS: 1.0000 | ORAL_TABLET | Freq: Every day | ORAL | 3 refills | Status: DC

## 2024-04-19 NOTE — Assessment & Plan Note (Signed)
 New Has had high blood pressures for a little while, but have been persistently elevated at home Need to start treatment Start valsartan -HCTZ 80-12.5 mg daily Low-sodium diet Monitor BP at home Follow-up in 3 weeks

## 2024-04-19 NOTE — Patient Instructions (Addendum)
° ° ° °  Medications changes include :   valsartan -hydrochlorothiazide  80-12.5 mg daily      Return in about 3 weeks (around 05/10/2024) for follow up.

## 2024-05-09 ENCOUNTER — Encounter: Payer: Self-pay | Admitting: Internal Medicine

## 2024-05-09 NOTE — Patient Instructions (Addendum)
" ° ° ° ° °  Blood work was ordered.       Medications changes include :   increase valsartan  - hct 160-12.5 mg      Return in about 29 weeks (around 12/01/2024) for Physical Exam.  "

## 2024-05-09 NOTE — Progress Notes (Unsigned)
 "     Subjective:    Patient ID: Gilbert Perkins, male    DOB: 03/29/1971, 54 y.o.   MRN: [id number]     HPI Gilbert Perkins is here for follow up of his chronic medical problems.  Started on valsartan -hydrochlorothiazide  80-12.5  mg daily 3 weeks ago.    SBP at home 130-150. No side effects  Medications and allergies reviewed with patient and updated if appropriate.  Medications Ordered Prior to Encounter[1]   Review of Systems  Respiratory:  Negative for shortness of breath.   Cardiovascular:  Negative for chest pain, palpitations and leg swelling.  Neurological:  Negative for dizziness, light-headedness and headaches.       Objective:   Vitals:   05/12/24 0807 05/12/24 0846  BP: (!) 142/96 138/88  Pulse: 93   Temp: 97.9 F (36.6 C)   SpO2: 97%    BP Readings from Last 3 Encounters:  05/12/24 138/88  04/19/24 (!) 160/90  11/28/23 124/80   Wt Readings from Last 3 Encounters:  05/12/24 234 lb (106.1 kg)  11/28/23 214 lb (97.1 kg)  05/22/23 218 lb (98.9 kg)   Body mass index is 32.64 kg/m.    Physical Exam Constitutional:      General: He is not in acute distress.    Appearance: Normal appearance. He is not ill-appearing.  HENT:     Head: Normocephalic and atraumatic.  Eyes:     Conjunctiva/sclera: Conjunctivae normal.  Cardiovascular:     Rate and Rhythm: Normal rate and regular rhythm.     Heart sounds: Normal heart sounds.  Pulmonary:     Effort: Pulmonary effort is normal. No respiratory distress.     Breath sounds: Normal breath sounds. No wheezing or rales.  Musculoskeletal:     Right lower leg: No edema.     Left lower leg: No edema.  Skin:    General: Skin is warm and dry.     Findings: No rash.  Neurological:     Mental Status: He is alert.        Lab Results  Component Value Date   WBC 6.3 11/20/2023   HGB 17.9 (H) 11/20/2023   HCT 54.4 Repeated and verified X2. (H) 11/20/2023   PLT 269.0 11/20/2023   GLUCOSE 108 (H) 11/20/2023    CHOL 132 11/20/2023   TRIG 159.0 (H) 11/20/2023   HDL 29.00 (L) 11/20/2023   LDLDIRECT 160.0 10/03/2021   LDLCALC 71 11/20/2023   ALT 32 11/20/2023   AST 28 11/20/2023   NA 139 11/20/2023   K 4.6 11/20/2023   CL 102 11/20/2023   CREATININE 1.35 11/20/2023   BUN 11 11/20/2023   CO2 31 11/20/2023   TSH 1.09 11/20/2023   PSA 0.80 11/20/2023   HGBA1C 5.9 11/20/2023     Assessment & Plan:    See Problem List for Assessment and Plan of chronic medical problems.       [1]  Current Outpatient Medications on File Prior to Visit  Medication Sig Dispense Refill   acyclovir  (ZOVIRAX ) 400 MG tablet TAKE 1 TABLET BY MOUTH EVERY DAY 90 tablet 1   albuterol  (PROVENTIL  HFA) 108 (90 Base) MCG/ACT inhaler INHALE 2 PUFFS INTO THE LUNGS EVERY 6 HOURS AS NEEDED FOR WHEEZING OR SHORTNESS OF BREATH 6.7 g 11   ALPRAZolam  (XANAX ) 0.25 MG tablet Take 1 tablet (0.25 mg total) by mouth at bedtime. 45 tablet 0   fluticasone  (FLONASE ) 50 MCG/ACT nasal spray 2 puffs each nostril once daily (Patient  taking differently: Place 2 sprays into both nostrils daily as needed for allergies.) 16 g 12   ibuprofen  (ADVIL ) 200 MG tablet Take 400-600 mg by mouth every 6 (six) hours as needed for moderate pain.     montelukast  (SINGULAIR ) 10 MG tablet TAKE 1 TABLET BY MOUTH AT  BEDTIME 30 tablet 11   Multiple Vitamin (MULTIVITAMIN WITH MINERALS) TABS tablet Take 1 tablet by mouth daily.     naproxen sodium (ALEVE) 220 MG tablet Take 220 mg by mouth daily as needed (pain).     oxymetazoline  (AFRIN NASAL SPRAY) 0.05 % nasal spray Place 1 spray into both nostrils at bedtime. One nostril every night (Patient taking differently: Place 1 spray into both nostrils at bedtime as needed for congestion.) 30 mL 5   rosuvastatin  (CRESTOR ) 10 MG tablet TAKE 1 TABLET BY MOUTH DAILY 90 tablet 3   tadalafil  (CIALIS ) 20 MG tablet Take 0.5-1 tablets (10-20 mg total) by mouth every other day as needed for erectile dysfunction. 30 tablet 0    tamsulosin  (FLOMAX ) 0.4 MG CAPS capsule Take 1 capsule (0.4 mg total) by mouth daily. 30 capsule 11   No current facility-administered medications on file prior to visit.   "

## 2024-05-12 ENCOUNTER — Ambulatory Visit: Admitting: Internal Medicine

## 2024-05-12 VITALS — BP 138/88 | HR 93 | Temp 97.9°F | Wt 234.0 lb

## 2024-05-12 DIAGNOSIS — I10 Essential (primary) hypertension: Secondary | ICD-10-CM

## 2024-05-12 LAB — BASIC METABOLIC PANEL WITH GFR
BUN: 14 mg/dL (ref 6–23)
CO2: 29 meq/L (ref 19–32)
Calcium: 9.4 mg/dL (ref 8.4–10.5)
Chloride: 101 meq/L (ref 96–112)
Creatinine, Ser: 1.28 mg/dL (ref 0.40–1.50)
GFR: 62.85 mL/min
Glucose, Bld: 96 mg/dL (ref 70–99)
Potassium: 3.8 meq/L (ref 3.5–5.1)
Sodium: 137 meq/L (ref 135–145)

## 2024-05-12 MED ORDER — VALSARTAN-HYDROCHLOROTHIAZIDE 160-12.5 MG PO TABS: 1.0000 | ORAL_TABLET | Freq: Every day | ORAL | 3 refills | Status: AC

## 2024-05-12 NOTE — Assessment & Plan Note (Addendum)
 Chronic Started medication 3 weeks ago Blood pressure improved-still not ideally controlled Increase medication -> valsartan -hct 160-12.5 mg daily Continue to monitor at home - discussed goal - he will let me know if BP is not at goal BMP today

## 2024-05-13 ENCOUNTER — Ambulatory Visit: Payer: Self-pay | Admitting: Internal Medicine

## 2024-11-30 ENCOUNTER — Encounter: Admitting: Internal Medicine
